# Patient Record
Sex: Male | Born: 1940 | Race: White | Hispanic: No | Marital: Married | Smoking: Never smoker
Health system: Southern US, Community
[De-identification: ages and names within clinical notes are randomized; demographics above are authoritative.]

## PROBLEM LIST (undated history)

## (undated) DIAGNOSIS — B070 Plantar wart: Secondary | ICD-10-CM

## (undated) DIAGNOSIS — I499 Cardiac arrhythmia, unspecified: Secondary | ICD-10-CM

## (undated) DIAGNOSIS — E669 Obesity, unspecified: Secondary | ICD-10-CM

## (undated) DIAGNOSIS — E785 Hyperlipidemia, unspecified: Secondary | ICD-10-CM

## (undated) DIAGNOSIS — Z6832 Body mass index (BMI) 32.0-32.9, adult: Secondary | ICD-10-CM

## (undated) DIAGNOSIS — K56609 Unspecified intestinal obstruction, unspecified as to partial versus complete obstruction: Secondary | ICD-10-CM

## (undated) DIAGNOSIS — E119 Type 2 diabetes mellitus without complications: Secondary | ICD-10-CM

## (undated) DIAGNOSIS — K219 Gastro-esophageal reflux disease without esophagitis: Secondary | ICD-10-CM

## (undated) DIAGNOSIS — J45909 Unspecified asthma, uncomplicated: Secondary | ICD-10-CM

## (undated) DIAGNOSIS — J339 Nasal polyp, unspecified: Secondary | ICD-10-CM

## (undated) DIAGNOSIS — R030 Elevated blood-pressure reading, without diagnosis of hypertension: Secondary | ICD-10-CM

## (undated) DIAGNOSIS — R809 Proteinuria, unspecified: Secondary | ICD-10-CM

## (undated) DIAGNOSIS — I4891 Unspecified atrial fibrillation: Secondary | ICD-10-CM

## (undated) DIAGNOSIS — J309 Allergic rhinitis, unspecified: Secondary | ICD-10-CM

## (undated) DIAGNOSIS — E7800 Pure hypercholesterolemia, unspecified: Secondary | ICD-10-CM

## (undated) HISTORY — DX: Nasal polyp, unspecified: J33.9

## (undated) HISTORY — DX: Plantar wart: B07.0

## (undated) HISTORY — DX: Proteinuria, unspecified: R80.9

## (undated) HISTORY — DX: Allergic rhinitis, unspecified: J30.9

## (undated) HISTORY — DX: Elevated blood-pressure reading, without diagnosis of hypertension: R03.0

## (undated) HISTORY — DX: Unspecified atrial fibrillation: I48.91

## (undated) HISTORY — DX: Cardiac arrhythmia, unspecified: I49.9

## (undated) HISTORY — DX: Obesity, unspecified: E66.9

## (undated) HISTORY — DX: Body mass index (BMI) 32.0-32.9, adult: Z68.32

## (undated) HISTORY — DX: Unspecified intestinal obstruction, unspecified as to partial versus complete obstruction: K56.609

## (undated) HISTORY — DX: Type 2 diabetes mellitus without complications: E11.9

## (undated) HISTORY — DX: Gastro-esophageal reflux disease without esophagitis: K21.9

## (undated) HISTORY — DX: Pure hypercholesterolemia, unspecified: E78.00

## (undated) HISTORY — DX: Hyperlipidemia, unspecified: E78.5

## (undated) HISTORY — DX: Unspecified asthma, uncomplicated: J45.909

---

## 1964-01-26 HISTORY — PX: CYST EXCISION: SHX5701

## 1998-10-19 ENCOUNTER — Encounter: Payer: Self-pay | Admitting: Family Medicine

## 1998-10-19 ENCOUNTER — Ambulatory Visit (HOSPITAL_COMMUNITY): Admission: RE | Admit: 1998-10-19 | Discharge: 1998-10-19 | Payer: Self-pay | Admitting: Family Medicine

## 2004-02-05 ENCOUNTER — Ambulatory Visit (HOSPITAL_COMMUNITY): Admission: RE | Admit: 2004-02-05 | Discharge: 2004-02-05 | Payer: Self-pay | Admitting: Family Medicine

## 2004-06-03 ENCOUNTER — Ambulatory Visit: Payer: Self-pay | Admitting: Pulmonary Disease

## 2004-08-12 ENCOUNTER — Ambulatory Visit: Payer: Self-pay | Admitting: Pulmonary Disease

## 2005-07-29 ENCOUNTER — Ambulatory Visit: Payer: Self-pay | Admitting: Pulmonary Disease

## 2006-09-30 ENCOUNTER — Ambulatory Visit: Payer: Self-pay | Admitting: Pulmonary Disease

## 2007-10-25 ENCOUNTER — Ambulatory Visit: Payer: Self-pay | Admitting: Pulmonary Disease

## 2007-10-25 DIAGNOSIS — J45909 Unspecified asthma, uncomplicated: Secondary | ICD-10-CM

## 2007-10-26 ENCOUNTER — Telehealth: Payer: Self-pay | Admitting: Pulmonary Disease

## 2008-01-26 HISTORY — PX: ABDOMINAL EXPLORATION SURGERY: SHX538

## 2008-10-24 ENCOUNTER — Ambulatory Visit: Payer: Self-pay | Admitting: Pulmonary Disease

## 2009-03-05 ENCOUNTER — Telehealth (INDEPENDENT_AMBULATORY_CARE_PROVIDER_SITE_OTHER): Payer: Self-pay | Admitting: *Deleted

## 2010-02-24 NOTE — Progress Notes (Signed)
Summary: Records request from [facility]  Request for records received from [facility]. Request forwarded to [facility]. Wilder Glade  March 05, 2009 4:23 PM

## 2010-04-10 ENCOUNTER — Ambulatory Visit (INDEPENDENT_AMBULATORY_CARE_PROVIDER_SITE_OTHER): Payer: Medicare Other | Admitting: Pulmonary Disease

## 2010-04-10 ENCOUNTER — Encounter: Payer: Self-pay | Admitting: Pulmonary Disease

## 2010-04-10 DIAGNOSIS — J45909 Unspecified asthma, uncomplicated: Secondary | ICD-10-CM

## 2010-04-14 NOTE — Assessment & Plan Note (Signed)
Summary: rov for asthma    Primary Provider/Referring Provider:  Thacker  CC:  Follow up appt.    Pt states his breathing is "good."  Denies any sob.  States he hasn't needed to use rescue hfa.  Pt states he only uses his advair once daily. Arthur Liu  History of Present Illness: the pt comes in today for f/u of his known asthma.  He has been taking his advair compliantly at one inhalation a day, and has not had breathing issues or any recent acute exacerbation.  He is asking about coming off meds since he is doing so well, but I have reviewed with him again the pathophysiology of asthma with ongoing airway inflammation.  I have explained it is a chronic disease process and needs daily treatment.  He rarely uses his rescue inhaler.   Preventive Screening-Counseling & Management  Alcohol-Tobacco     Smoking Status: never  Current Medications (verified): 1)  Advair Diskus 250-50 Mcg/dose Misc (Fluticasone-Salmeterol) .... Inhale 1 Puff As Directed Twice A Day 2)  Crestor 20 Mg Tabs (Rosuvastatin Calcium) .... Once Daily 3)  Adult Aspirin Ec Low Strength 81 Mg Tbec (Aspirin) .... Once Daily 4)  Proair Hfa 108 (90 Base) Mcg/act  Aers (Albuterol Sulfate) .Arthur Liu.. 1-2 Puffs Every 4-6 Hours As Needed  Allergies (verified): No Known Drug Allergies  Social History: Patient never smoked.  Smoking Status:  never  Review of Systems  The patient denies shortness of breath with activity, shortness of breath at rest, productive cough, non-productive cough, coughing up blood, chest pain, irregular heartbeats, acid heartburn, indigestion, loss of appetite, weight change, abdominal pain, difficulty swallowing, sore throat, tooth/dental problems, headaches, nasal congestion/difficulty breathing through nose, sneezing, itching, ear ache, anxiety, depression, hand/feet swelling, joint stiffness or pain, rash, change in color of mucus, and fever.    Vital Signs:  Patient profile:   70 year old male Height:      73  inches Weight:      277.13 pounds BMI:     36.70 O2 Sat:      97 % on Room air Temp:     98.0 degrees F oral Pulse rate:   88 / minute BP sitting:   136 / 82  (right arm) Cuff size:   large  Vitals Entered By: Arman Filter LPN (April 10, 2010 9:11 AM)  O2 Flow:  Room air CC: Follow up appt.    Pt states his breathing is "good."  Denies any sob.  States he hasn't needed to use rescue hfa.  Pt states he only uses his advair once daily.  Comments Medications reviewed with patient Arman Filter LPN  April 10, 2010 9:12 AM    Physical Exam  General:  wd male in nad  Lungs:  totally clear to auscultation  Heart:  rrr, 2/6 sem Extremities:  mild ankle edema, no cyanosis  Neurologic:  alert and oriented, moves all 4 .    Impression & Recommendations:  Problem # 1:  INTRINSIC ASTHMA, UNSPECIFIED (ICD-493.10) the pt is doing well on advair at one inhalation a day, and I discussed de-escalating his treatment to ICS alone.  He would rather stick with what is working.  I have asked him to continue with current treatment, and to followup with me in one year.   Medications Added to Medication List This Visit: 1)  Advair Diskus 250-50 Mcg/dose Misc (Fluticasone-salmeterol) .... Inhale 1 puff once daily  Other Orders: Est. Patient Level III (40981)  Patient Instructions: 1)  continue on advair and albuterol as needed. 2)  work on weight reduction and exercise program 3)  followup with me in one year   Prescriptions: PROAIR HFA 108 (90 BASE) MCG/ACT  AERS (ALBUTEROL SULFATE) 1-2 puffs every 4-6 hours as needed  #2 x 12   Entered and Authorized by:   Barbaraann Share MD   Signed by:   Barbaraann Share MD on 04/10/2010   Method used:   Print then Give to Patient   RxID:   [id number]

## 2010-10-18 ENCOUNTER — Emergency Department (HOSPITAL_COMMUNITY): Payer: Medicare Other

## 2010-10-18 ENCOUNTER — Inpatient Hospital Stay (HOSPITAL_COMMUNITY)
Admission: EM | Admit: 2010-10-18 | Discharge: 2010-10-19 | DRG: 313 | Disposition: A | Discharge status: Home / Self Care | Payer: Medicare Other | Attending: Family Medicine | Admitting: Family Medicine

## 2010-10-18 DIAGNOSIS — R079 Chest pain, unspecified: Principal | ICD-10-CM | POA: Diagnosis present

## 2010-10-18 DIAGNOSIS — K219 Gastro-esophageal reflux disease without esophagitis: Secondary | ICD-10-CM | POA: Diagnosis present

## 2010-10-18 DIAGNOSIS — J45909 Unspecified asthma, uncomplicated: Secondary | ICD-10-CM | POA: Diagnosis present

## 2010-10-18 DIAGNOSIS — E785 Hyperlipidemia, unspecified: Secondary | ICD-10-CM | POA: Diagnosis present

## 2010-10-19 LAB — COMPREHENSIVE METABOLIC PANEL
ALT: 22 U/L (ref 0–53)
Alkaline Phosphatase: 60 U/L (ref 39–117)
BUN: 16 mg/dL (ref 6–23)
CO2: 28 mEq/L (ref 19–32)
Calcium: 9.3 mg/dL (ref 8.4–10.5)
GFR calc Af Amer: >60 mL/min (ref >60)
GFR calc non Af Amer: >60 mL/min (ref >60)
Glucose, Bld: 109 mg/dL — ABNORMAL HIGH (ref 70–99)
Sodium: 140 mEq/L (ref 135–145)

## 2010-10-19 LAB — DIFFERENTIAL
Basophils Absolute: 0 10*3/uL (ref 0.0–0.1)
Basophils Relative: 0 % (ref 0–1)
Eosinophils Absolute: 0.2 10*3/uL (ref 0.0–0.7)
Eosinophils Relative: 2 % (ref 0–5)
Lymphocytes Relative: 38 % (ref 12–46)
Lymphs Abs: 3.6 10*3/uL (ref 0.7–4.0)
Monocytes Absolute: 0.6 10*3/uL (ref 0.1–1.0)
Monocytes Relative: 7 % (ref 3–12)
Neutro Abs: 5 10*3/uL (ref 1.7–7.7)
Neutrophils Relative %: 53 % (ref 43–77)

## 2010-10-19 LAB — CBC
MCH: 30.6 pg (ref 26.0–34.0)
MCHC: 33.6 g/dL (ref 30.0–36.0)
MCV: 91.3 fL (ref 78.0–100.0)
Platelets: 255 10*3/uL (ref 150–400)
RBC: 5.03 MIL/uL (ref 4.22–5.81)

## 2010-10-19 LAB — POCT I-STAT, CHEM 8
Calcium, Ion: 1.18 mmol/L (ref 1.12–1.32)
HCT: 48 % (ref 39.0–52.0)
TCO2: 28 mmol/L (ref 0–100)

## 2010-10-19 LAB — LIPID PANEL
HDL: 37 mg/dL — ABNORMAL LOW (ref >39)
LDL Cholesterol: 56 mg/dL (ref 0–99)
Triglycerides: 53 mg/dL (ref <150)
VLDL: 11 mg/dL (ref 0–40)

## 2010-10-19 LAB — POCT I-STAT TROPONIN I: Troponin i, poc: 0 ng/mL (ref 0.00–0.08)

## 2010-10-19 LAB — CK TOTAL AND CKMB (NOT AT ARMC)
Relative Index: INVALID (ref 0.0–2.5)
Total CK: 61 U/L (ref 7–232)

## 2010-10-20 NOTE — Discharge Summary (Signed)
NAMEMarland Kitchen  Arthur Liu, Arthur Liu NO.:  [id number]  MEDICAL RECORD NO.:  [id number]  LOCATION:  1441                         FACILITY:  [facility]  PHYSICIAN:  Brendia Sacks, MD    DATE OF BIRTH:  21-Sep-1940  DATE OF ADMISSION:  10/18/2010 DATE OF DISCHARGE:  10/19/2010                              DISCHARGE SUMMARY   PRIMARY CARE PHYSICIAN:  Caryn Bee L. Little, M.D.  CONDITION ON DISCHARGE:  Improved.  DISPOSITION:  Home.  DISCHARGE DIAGNOSES: 1. Atypical chest pain. 2. History of gastroesophageal reflux disease and hyperlipidemia.  HISTORY OF PRESENT ILLNESS:  This is a 70 year old male who presented with chest pain.  The patient had several episodes of chest pain, his first was while he was lying in bed, September 22nd, and describes it as somewhat of a burning sensation in his chest.  This did not last very long, self-resolved, and he went about his day.  It occurred a second time on September 23rd after eating dinner while he was sitting up.  It persisted for 4-5 hours and so he came to the emergency room for further evaluation.  He was given aspirin.  His symptoms resolved in the emergency room, or by the time of arrival he is not exactly sure, but the only medication he was given was aspirin and he is not clear whether that had any effect on his symptoms or not.  Because of his risk factors, he was admitted for further evaluation.  The patient is a fairly active man and walks at least 3 times per week. He went for a 45 minute walk with his wife 2 days ago.  He has never had any chest pain or shortness of breath, no exertional symptoms what ever with his normal activity levels.  He does have a history of reflux in the past; in 2006, he had an upper GI done, which showed 2 episodes of gastroesophageal reflux and a widely patent distal esophageal mucosal ring that was not thought to be causing symptoms.  Additionally, at that time, the study showed a partial  disruption of primary peristaltic waves in the esophagus, suggesting mild nonspecific esophageal dysfunction. This study was done for GERD and wheezing at night.  I think the patient's history is most suggestive of recurrent gastroesophageal symptoms, given 2 episodes occurring while lying in bed and one occurring after a meal.  Additionally, he has had no symptoms with exertion.  He has had no pain in his left arm, neck, or jaw.  No nausea or vomiting or diaphoresis.  He had a transient episode this morning while he was lying down, as soon as he sat up he felt much better and he has had no symptoms since.  Additionally, he has recently done 2 things that he has not done for some time.  One is he started golfing again and also he, on Saturday, 2 days ago, he tested out some weights machine including a fly machine. His wife thinks this might be musculoskeletal in nature.  HOSPITAL COURSE:  Mr. Cwikla was admitted to the medical floor on telemetry and ruled out with serial cardiac enzymes.  His telemetry shows sinus rhythm with no acute changes.  Remainder of his laboratory studies have been unremarkable and his chest x-ray was also unremarkable.  As noted in the history above, this is most likely gastrointestinal in etiology and I think he would benefit from being placed back on Nexium.  Another possibility would be musculoskeletal in nature.  Given his usually in active man and lack of typical symptoms, I do doubt that this is cardiac in nature; however, he does have risk factors including hyperlipidemia.  He has multiple family members with a history of myocardial infarction, of note none of them were of premature age.  His mother had heart attack at age 45, not age 67.  At this point, he feels quite well and he would like to go home.  I did discuss the case with Dr. Eldridge Dace of [facility] who will arrange for outpatient nuclear stress testing as I do think this would be a  useful study.  However, I do not think he requires further inpatient evaluation.  IMAGING:  Chest x-ray, no acute cardiopulmonary disease.  PERTINENT LABORATORY STUDIES: 1. TSH was within normal limits. 2. Lipid profile was notable for an LDL of 56, total cholesterol of     104. 3. CBC was within normal limits. 4. Basic metabolic panel was unremarkable. 5. Cardiac enzymes were entirely within normal limits. 6. EKG showed sinus rhythm with no acute changes.  In fact the patient     had multiple EKGs done during this hospitalization, all of them     showing sinus rhythm with no acute changes.  DISCHARGE INSTRUCTIONS:  The patient will be discharged home today. [facility] cardiology will contact him to arrange for stress testing, increase activity slowly.  Low-sodium heart-healthy diet.  DISCHARGE MEDICATIONS:  New, 1. Nexium 40 mg p.o. daily. 2. Nitroglycerin sublingual 0.4 mg, under the tongue, as needed for     chest pain and call 911.  Doubt cardiac disease, but until nuclear     stress testing will provide him with his prescription. 3. Advair.  Resume the following medications. 1. Advair Diskus 250/50 one puff daily. 2. Aspirin mg p.o. daily. 3. Crestor 20 mg p.o. daily.  TIME COORDINATING DISCHARGE:  35 minutes.     Brendia Sacks, MD     DG/MEDQ  D:  10/19/2010  T:  10/20/2010  Job:  [id number]  cc:   Caryn Bee L. Little, M.D. Fax: [phone]  [facility]  Electronically Signed by Brendia Sacks  on 10/20/2010 10:31:26 PM

## 2010-11-05 NOTE — H&P (Signed)
NAMEMarland Liu  PRABHAV, FAULKENBERRY NO.:  [id number]  MEDICAL RECORD NO.:  [id number]  LOCATION:  1441                         FACILITY:  [facility]  PHYSICIAN:  [name], MDDATE OF BIRTH:  Jan 18, 1941  DATE OF ADMISSION:  10/18/2010 DATE OF DISCHARGE:                             HISTORY & PHYSICAL   ADMISSION DIAGNOSIS:  Chest pain.  HISTORY OF PRESENT ILLNESS:  Arthur Liu was admitted from the emergency department with symptoms of chest pain.  He states that he was awakened from sleep earlier in the evening with symptoms of left sided and sternal chest pain.  He states it lasted approximately 10 to 12 minutes and was relieved with walking.  After experiencing several episodes, he sought medical attention.  He currently denies any chest pain, any shortness of breath, any nausea, or vomiting.  PAST MEDICAL HISTORY: 1. Hypertension. 2. Hyperlipidemia. 3. Asthma.  ALLERGIES:  No known drug allergies.  CURRENT MEDICATIONS: 1. Crestor 20 mg daily. 2. Aspirin 81 mg daily. 3. Advair 50/250 q.a.m.  FAMILY HISTORY:  Arthur Liu mother and father both deceased from myocardial infarction at an early age 43 and 24 respectively.  He has one brother who is also recently deceased in 04-14-2022 of this year from complications due to myocardial infarction.  SOCIAL HISTORY:  Arthur Liu is married.  He is semi-retired.  He does not smoke cigarettes.  He drinks alcohol occasionally.  He denies any illicit drug use.  REVIEW OF SYSTEMS:  All systems reviewed and are otherwise negative except for that covered in history of present illness.  PHYSICAL EXAMINATION:  VITAL SIGNS:  Temperature 97.3, pulse 68, blood pressure 156/83, respirations 20, oxygen saturation 97% on room air. GENERAL:  [name] is alert and oriented.  He is in no acute distress. HEENT:  Within normal limits.  Oropharynx clear. LUNGS:  Clear to auscultation bilaterally. CARDIAC:  Regular rate and rhythm  without murmurs, rubs, or gallop. ABDOMEN:  Soft, nontender.  No appreciated organomegaly or masses. EXTREMITIES:  Without edema or cyanosis. NEUROLOGIC:  Without focal deficits.  LABORATORY DATA:  Hemoglobin 15.4, hematocrit 45.9, platelets 255. Differential:  Segmented neutrophils 53%, lymphocytes 38%, monocytes 7%, eosinophils 2%.  PT 13.1, INR 0.97, PTT 36.  IMPRESSION AND PLAN:  A 70 year old gentleman admitted with chest pain, 1. Rule out myocardial infarction.  Admitted to inpatient telemetry, [facility]    Hospitalist Team 5. Evaluate EKG, placed on 2 L nasal    cannula, nitroglycerin as needed for chest pain, Morphine 2 mg IV    every 4 hours as needed for pain, Aspirin 325 mg daily.  Continue    to cycle cardiac enzymes, troponin every 6 hours x2 more sets. 2. Hyperlipidemia.  Continue Crestor. 3. History of asthma.  Continue Advair q.a.m. 4. Fluids, electrolytes, nutrition.  Saline Lock peripheral IV.    Obtain CMET and address electrolytes as indicated.  Heart-     healthy diet. 5. Prophylaxis.  Lovenox for deep venous thrombosis prophylaxis. 6. Ethics.  Full code.    ______________________________ Arthur Mcgregor, NP   ______________________________ [name], MD    ML/MEDQ  D:  10/19/2010  T:  10/19/2010  Job:  [id number]  Electronically Signed by Arthur Liu ANP-BC on 10/20/2010 07:21:04 PM Electronically Signed by Arthur Loll MD on 11/05/2010 08:04:05 AM

## 2011-01-08 ENCOUNTER — Other Ambulatory Visit: Payer: Self-pay | Admitting: Pulmonary Disease

## 2012-03-06 ENCOUNTER — Other Ambulatory Visit: Payer: Self-pay | Admitting: Pulmonary Disease

## 2012-03-06 NOTE — Telephone Encounter (Signed)
Patient requests refill of ADVAIR 250-50 DISKUS [facility] [address] [facility] [address] Last OV 04/10/10 NO upcoming OV  Refill has been sent to pharmacy with note to follow up with [facility] in order to receive further refills.   ADVAIR 250-50 DISKUS Sig: INHALE 1 PUFF TWICE DAILY AS DIRECTED Dispense: 60 each Refills: 0 NEEDS TO AN APPT FOR FURTHER REFILLS.

## 2012-05-08 ENCOUNTER — Other Ambulatory Visit: Payer: Self-pay | Admitting: Pulmonary Disease

## 2012-05-09 ENCOUNTER — Telehealth: Payer: Self-pay | Admitting: Pulmonary Disease

## 2012-05-09 MED ORDER — FLUTICASONE-SALMETEROL 250-50 MCG/DOSE IN AEPB: 1.0000 | INHALATION_SPRAY | Freq: Two times a day (BID) | RESPIRATORY_TRACT | Status: DC

## 2012-05-09 NOTE — Telephone Encounter (Signed)
Spoke with patient, patient requesting refill on Advair. States he is leaving town tomorrow afternoon and would like Rx sent in to pharmacy. Patient last seen 04/10/2010  Patient has scheduled follow up 06/05/12 at 1030am. (this is earliest patient can come, he will be in and out of town) Dr. Shelle Iron can this Rx be sent for patient?

## 2012-05-09 NOTE — Telephone Encounter (Signed)
Spoke with patient, patient aware Rx sent to pharmacy. Aware he must keep f/u appt for further refills. Nothing further needed at this time.

## 2012-05-09 NOTE — Telephone Encounter (Signed)
Ok to give him #1, no fills.

## 2012-06-05 ENCOUNTER — Encounter: Payer: Self-pay | Admitting: Pulmonary Disease

## 2012-06-05 ENCOUNTER — Ambulatory Visit (INDEPENDENT_AMBULATORY_CARE_PROVIDER_SITE_OTHER): Payer: Medicare Other | Admitting: Pulmonary Disease

## 2012-06-05 VITALS — BP 140/80 | HR 68 | Temp 97.0°F | Ht 72.0 in | Wt 249.6 lb

## 2012-06-05 DIAGNOSIS — J45909 Unspecified asthma, uncomplicated: Secondary | ICD-10-CM

## 2012-06-05 MED ORDER — ALBUTEROL SULFATE HFA 108 (90 BASE) MCG/ACT IN AERS: 2.0000 | INHALATION_SPRAY | Freq: Four times a day (QID) | RESPIRATORY_TRACT | Status: AC | PRN

## 2012-06-05 MED ORDER — FLUTICASONE-SALMETEROL 250-50 MCG/DOSE IN AEPB: 1.0000 | INHALATION_SPRAY | Freq: Two times a day (BID) | RESPIRATORY_TRACT | Status: DC

## 2012-06-05 NOTE — Assessment & Plan Note (Signed)
The patient is doing very well from an asthma standpoint, with no recent exacerbation or rescue inhaler use.  I've asked him to continue on his current medications, and to stay as active as possible.  He will followup with me on a yearly basis.

## 2012-06-05 NOTE — Patient Instructions (Addendum)
Stay on current asthma medications. followup with me in one year.

## 2012-06-05 NOTE — Progress Notes (Signed)
  Subjective:    Patient ID: Arthur Liu, male    DOB: 12-20-1940, 72 y.o.   MRN: [id number]  HPI The patient comes in today for followup of his known asthma.  He has not been seen in 2 years, but has done very well from a pulmonary standpoint.  He has stayed compliant on his Advair at a once a day dosing, and has not required his rescue inhaler.  He has not had any acute exacerbation, and is satisfied with his control.   Review of Systems  Constitutional: Negative for fever and unexpected weight change.  HENT: Negative for ear pain, nosebleeds, congestion, sore throat, rhinorrhea, sneezing, trouble swallowing, dental problem, postnasal drip and sinus pressure.        Allergies  Eyes: Negative for redness and itching.  Respiratory: Negative for cough, chest tightness, shortness of breath and wheezing.   Cardiovascular: Negative for palpitations and leg swelling.  Gastrointestinal: Negative for nausea and vomiting.  Genitourinary: Negative for dysuria.  Musculoskeletal: Negative for joint swelling.  Skin: Negative for rash.  Neurological: Negative for headaches.  Hematological: Does not bruise/bleed easily.  Psychiatric/Behavioral: Negative for dysphoric mood. The patient is not nervous/anxious.        Objective:   Physical Exam Well developed male in nad Nose without purulence or discharge noted. Neck without LN or TMG Chest with clear bs bilat, no wheezing Cor with rrr LE without edema, no cyanosis Alert and oriented, moves all 4.        Assessment & Plan:

## 2012-08-25 ENCOUNTER — Other Ambulatory Visit: Payer: Self-pay | Admitting: Family Medicine

## 2012-08-25 DIAGNOSIS — J329 Chronic sinusitis, unspecified: Secondary | ICD-10-CM

## 2012-08-28 ENCOUNTER — Ambulatory Visit
Admission: RE | Admit: 2012-08-28 | Discharge: 2012-08-28 | Disposition: A | Discharge status: Home / Self Care | Payer: Medicare Other | Source: Ambulatory Visit | Attending: Family Medicine | Admitting: Family Medicine

## 2012-08-28 DIAGNOSIS — J329 Chronic sinusitis, unspecified: Secondary | ICD-10-CM

## 2012-11-06 ENCOUNTER — Ambulatory Visit (INDEPENDENT_AMBULATORY_CARE_PROVIDER_SITE_OTHER): Payer: Medicare Other | Admitting: Internal Medicine

## 2012-11-06 ENCOUNTER — Encounter: Payer: Self-pay | Admitting: Internal Medicine

## 2012-11-06 ENCOUNTER — Other Ambulatory Visit: Payer: Medicare Other

## 2012-11-06 VITALS — BP 118/66 | HR 70 | Ht 72.0 in | Wt 258.6 lb

## 2012-11-06 DIAGNOSIS — J309 Allergic rhinitis, unspecified: Secondary | ICD-10-CM

## 2012-11-06 DIAGNOSIS — J339 Nasal polyp, unspecified: Secondary | ICD-10-CM

## 2012-11-06 DIAGNOSIS — J302 Other seasonal allergic rhinitis: Secondary | ICD-10-CM

## 2012-11-06 MED ORDER — MOMETASONE FUROATE 50 MCG/ACT NA SUSP: 2.0000 | Freq: Every day | NASAL | Status: DC

## 2012-11-06 MED ORDER — METHYLPREDNISOLONE ACETATE 80 MG/ML IJ SUSP
80.0000 mg | Freq: Once | INTRAMUSCULAR | Status: AC
  Administered 2012-11-06: 80 mg via INTRAMUSCULAR

## 2012-11-06 NOTE — Patient Instructions (Signed)
Sample x 2 Nasonex nasal steroid spray    1-2 puffs each nostril once daily at bedtime  Depo 80    Dx nasal polyps. Allergic rhinitis  Order lab Allergy profile

## 2012-11-06 NOTE — Progress Notes (Signed)
11/06/12- 89 yoM never smoker referred courtesy of Dr Haroldine Laws for Allergy evaluation. Has been followed here by Dr Shelle Iron for asthma. Complains of "sinus trouble" every fall and spring with head congestion, stuffy nose. Not much sneeze or drainage. Repeated trials of antibiotics did not help.  No ENT surgery. Irregular trial of nasal sprays. Never considered himself and allergic person before. No recognized association with common allergens, grass, cats, house dust, mold. History of asthma controlled with Advair one time daily. Environment-lives in house with no basement, no recognized mold, no animals and no smokers. 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 He is aware of extensive nasal polyposis but does not want to consider surgery. 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  Sub total opacification of the ethmoid air cells and left maxillary  sinus. Right frontal and sphenoid sinuses relatively spared.  Original Report Authenticated By: Erskine Speed, M.D.  Prior to Admission medications   Medication Sig Start Date End Date Taking? Authorizing Provider  albuterol (PROVENTIL HFA;VENTOLIN HFA) 108 (90 BASE) MCG/ACT inhaler Inhale 2 puffs into the lungs every 6 (six) hours as needed for wheezing. 06/05/12  Yes Barbaraann Share, MD  aspirin 81 MG tablet Take 81 mg by mouth daily.   Yes Historical Provider, MD  Fluticasone-Salmeterol (ADVAIR DISKUS) 250-50 MCG/DOSE AEPB Inhale 1 puff into the lungs every 12 (twelve) hours. 06/05/12  Yes Barbaraann Share, MD  rosuvastatin (CRESTOR) 20 MG tablet Take 20 mg by mouth daily.   Yes Historical Provider, MD  mometasone (NASONEX) 50 MCG/ACT nasal spray Place 2 sprays into the nose daily. 11/06/12   Waymon Budge, MD   Past Medical History  Diagnosis Date  . Asthma   . Allergic rhinitis    Past  Surgical History  Procedure Laterality Date  . Abdominal exploration surgery  2010    found blockage on cxr---surgery showed nothing  . Cyst excision  1966   Family History  Problem Relation Age of Onset  . Heart attack Mother   . Heart attack Father   . Heart disease Brother   . Prostate cancer Brother    History   Social History  . Marital Status: Married    Spouse Name: N/A    Number of Children: 2  . Years of Education: N/A   Occupational History  . 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 Smoking status: Never Smoker   . Smokeless tobacco: Not on file  . Alcohol Use: Yes     Comment: occassional//rare-about 2 glasses of white wine a month  . Drug Use: No  . Sexual Activity: Not on file   Other Topics Concern  . Not on file   Social History Narrative  . No narrative on file   ROS-see HPI Constitutional:   No-   weight loss, night sweats, fevers, chills, fatigue, lassitude. HEENT:   No-  headaches, difficulty swallowing, tooth/dental problems, sore throat,       No-  sneezing, itching, ear ache, +nasal congestion, post nasal drip,  CV:  No-   chest pain, orthopnea, PND, swelling in lower extremities, anasarca, dizziness, palpitations Resp: No-   shortness of breath with exertion or at rest.              No-   productive cough,  No non-productive  cough,  No- coughing up of blood.              No-   change in color of mucus.  No- wheezing.   Skin: No-   rash or lesions. GI:  No-   heartburn, indigestion, abdominal pain, nausea, vomiting, diarrhea,                 change in bowel habits, loss of appetite GU: No-   dysuria, change in color of urine, no urgency or frequency.  No- flank pain. MS:  No-   joint pain or swelling.  No- decreased range of motion.  No- back pain. Neuro-     nothing unusual Psych:  No- change in mood or affect. No depression or anxiety.  No memory loss.  OBJ- Physical Exam General- Alert, Oriented,  Affect-appropriate, Distress- none acute Skin- rash-none, lesions- none, excoriation- none Lymphadenopathy- none Head- atraumatic            Eyes- Gross vision intact, PERRLA, conjunctivae and secretions clear            Ears- Hearing, canals-normal            Nose- +visible polyps R>L            Throat- Mallampati III , mucosa clear , drainage- none, tonsils- atrophic Neck- flexible , trachea midline, no stridor , thyroid nl, carotid no bruit Chest - symmetrical excursion , unlabored           Heart/CV- RRR , no murmur , no gallop  , no rub, nl s1 s2                           - JVD- none , edema- none, stasis changes- none, varices- none           Lung- clear to P&A, wheeze- none, cough- none , dullness-none, rub- none           Chest wall-  Abd- tender-no, distended-no, bowel sounds-present, HSM- no Br/ Gen/ Rectal- Not done, not indicated Extrem- cyanosis- none, clubbing, none, atrophy- none, strength- nl Neuro- grossly intact to observation  .

## 2012-11-07 LAB — ALLERGY FULL PROFILE
Alternaria Alternata: <0.1 kU/L
Bermuda Grass: 0.7 kU/L — ABNORMAL HIGH
Candida Albicans: <0.1 kU/L
Cat Dander: <0.1 kU/L
Curvularia lunata: <0.1 kU/L
Dog Dander: <0.1 kU/L
Fescue: 1.05 kU/L — ABNORMAL HIGH
Goldenrod: <0.1 kU/L
Helminthosporium halodes: <0.1 kU/L
Lamb's Quarters: <0.1 kU/L
Sycamore Tree: <0.1 kU/L

## 2012-11-09 NOTE — Progress Notes (Signed)
Quick Note:  Pt aware of results. ______ 

## 2012-11-19 DIAGNOSIS — J339 Nasal polyp, unspecified: Secondary | ICD-10-CM | POA: Insufficient documentation

## 2012-11-19 DIAGNOSIS — J302 Other seasonal allergic rhinitis: Secondary | ICD-10-CM | POA: Insufficient documentation

## 2012-11-19 NOTE — Assessment & Plan Note (Signed)
No history of aspirin intolerance. He is strongly averse to any surgical procedure. We discussed the role of steroids. Plan-Depo-Medrol, Nasonex long-term maintenance use with followup CT

## 2012-11-19 NOTE — Assessment & Plan Note (Signed)
Spring and fall predominance may reflect atopic allergies superimposed on underlying nasal polyposis Plan-allergy profile

## 2012-12-26 ENCOUNTER — Ambulatory Visit (INDEPENDENT_AMBULATORY_CARE_PROVIDER_SITE_OTHER): Payer: Medicare Other | Admitting: Internal Medicine

## 2012-12-26 ENCOUNTER — Encounter: Payer: Self-pay | Admitting: Internal Medicine

## 2012-12-26 VITALS — BP 120/62 | HR 74 | Ht 72.0 in | Wt 262.0 lb

## 2012-12-26 DIAGNOSIS — J45909 Unspecified asthma, uncomplicated: Secondary | ICD-10-CM

## 2012-12-26 DIAGNOSIS — J309 Allergic rhinitis, unspecified: Secondary | ICD-10-CM

## 2012-12-26 DIAGNOSIS — J339 Nasal polyp, unspecified: Secondary | ICD-10-CM

## 2012-12-26 DIAGNOSIS — J302 Other seasonal allergic rhinitis: Secondary | ICD-10-CM

## 2012-12-26 NOTE — Progress Notes (Signed)
11/06/12- 38 yoM never smoker referred courtesy of Dr Haroldine Laws for Allergy evaluation. Has been followed here by Dr Shelle Iron for asthma. Complains of "sinus trouble" every fall and spring with head congestion, stuffy nose. Not much sneeze or drainage. Repeated trials of antibiotics did not help.  No ENT surgery. Irregular trial of nasal sprays. Never considered himself and allergic person before. No recognized association with common allergens, grass, cats, house dust, mold. History of asthma controlled with Advair one time daily. Environment-lives in house with no basement, no recognized mold, no animals and no smokers. 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 He is aware of extensive nasal polyposis but does not want to consider surgery. 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  Sub total opacification of the ethmoid air cells and left maxillary  sinus. Right frontal and sphenoid sinuses relatively spared.  Original Report Authenticated By: Erskine Speed, M.D.  12/26/12- 89 yoM never smoker with severe nasal polyposis, referred courtesy of Dr Haroldine Laws for Allergy evaluation. Has been followed here by Dr Shelle Iron for asthma.  remains comfortable with no exacerbation- watching seasonal factors. 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  ROS-see HPI Constitutional:   No-   weight loss, night sweats, fevers, chills, fatigue, lassitude. HEENT:   No-  headaches, difficulty swallowing, tooth/dental problems, sore throat,       No-  sneezing, itching, ear ache, +nasal congestion, post nasal drip,  CV:  No-   chest pain, orthopnea, PND, swelling in lower extremities, anasarca, dizziness, palpitations Resp:  No-   shortness of breath with exertion or at rest.              No-   productive cough,  No non-productive cough,  No- coughing up of blood.              No-   change in color of mucus.  No- wheezing.   Skin: No-   rash or lesions. GI:  No-   heartburn, indigestion, abdominal pain, nausea, vomiting,  GU:  MS:  No-   joint pain or swelling.  Neuro-     nothing unusual Psych:  No- change in mood or affect. No depression or anxiety.  No memory loss.  OBJ- Physical Exam General- Alert, Oriented, Affect-appropriate, Distress- none acute Skin- rash-none, lesions- none, excoriation- none Lymphadenopathy- none Head- atraumatic            Eyes- Gross vision intact, PERRLA, conjunctivae and secretions clear            Ears- Hearing, canals-normal            Nose- +visible polyps R>L            Throat- Mallampati III , mucosa clear , drainage- none, tonsils- atrophic Neck- flexible , trachea midline, no stridor , thyroid nl, carotid no bruit Chest - symmetrical excursion , unlabored           Heart/CV- RRR , no murmur , no gallop  , no rub, nl s1 s2                           - JVD- none , edema- none, stasis changes- none,  varices- none           Lung- clear to P&A, wheeze- none, cough- none , dullness-none, rub- none           Chest wall-  Abd-  Br/ Gen/ Rectal- Not done, not indicated Extrem- cyanosis- none, clubbing, none, atrophy- none, strength- nl Neuro- grossly intact to observation  .

## 2012-12-26 NOTE — Patient Instructions (Signed)
We can wait and see how you do with your allergic rhinitis/ hayfever. I would anticipate this may begin to bother you in late March. You might want to be prepared then to start an antihistamine and/or decongestant. We can also resume a nasal spray if needed.   Ask your primary doctor about pneumonia vaccine

## 2013-01-14 NOTE — Assessment & Plan Note (Signed)
Discussed significance of improved symptoms since he stopped doing his own yard work and reduced that exposure.

## 2013-01-14 NOTE — Assessment & Plan Note (Signed)
PFT's 01/2004:  Ratio 49% Cleda Daub 07/2004:  FEV1 2.86 (71%), ratio 61  (after treatment).  Chronic nasal polyposis and he remains on aspirin without recognized aspirin allergy. Plan-he will discuss pneumonia vaccine with his primary physician

## 2013-01-14 NOTE — Assessment & Plan Note (Signed)
Chronic nasal polyposis. We again discussed nasal steroids.

## 2013-05-25 ENCOUNTER — Encounter: Payer: Self-pay | Admitting: Pulmonary Disease

## 2013-06-05 ENCOUNTER — Ambulatory Visit: Payer: Medicare Other | Admitting: Pulmonary Disease

## 2013-07-02 ENCOUNTER — Other Ambulatory Visit: Payer: Self-pay | Admitting: Pulmonary Disease

## 2013-08-31 ENCOUNTER — Ambulatory Visit (INDEPENDENT_AMBULATORY_CARE_PROVIDER_SITE_OTHER): Payer: Medicare Other | Admitting: Pulmonary Disease

## 2013-08-31 ENCOUNTER — Encounter: Payer: Self-pay | Admitting: Pulmonary Disease

## 2013-08-31 VITALS — BP 124/82 | HR 73 | Temp 98.2°F | Ht 72.0 in | Wt 274.2 lb

## 2013-08-31 DIAGNOSIS — J45909 Unspecified asthma, uncomplicated: Secondary | ICD-10-CM

## 2013-08-31 NOTE — Progress Notes (Signed)
   Subjective:    Patient ID: Arthur Liu, male    DOB: 1940-04-22, 73 y.o.   MRN: [id number]  HPI The patient comes in today for followup of his known asthma. He is been staying on his maintenance bronchodilator regimen, and has had no acute exacerbations or increased rescue inhaler use. He has been diagnosed with nasal polyposis, and is treating this conservatively. He is satisfied with his exertional tolerance at this time.   Review of Systems  Constitutional: Negative for fever and unexpected weight change.  HENT: Negative for congestion, dental problem, ear pain, nosebleeds, postnasal drip, rhinorrhea, sinus pressure, sneezing, sore throat and trouble swallowing.   Eyes: Negative for redness and itching.  Respiratory: Negative for cough, chest tightness, shortness of breath and wheezing.   Cardiovascular: Negative for palpitations and leg swelling.  Gastrointestinal: Negative for nausea and vomiting.  Genitourinary: Negative for dysuria.  Musculoskeletal: Negative for joint swelling.  Skin: Negative for rash.  Neurological: Negative for headaches.  Hematological: Does not bruise/bleed easily.  Psychiatric/Behavioral: Negative for dysphoric mood. The patient is not nervous/anxious.        Objective:   Physical Exam Overweight male in no acute distress Nose without purulence or discharge noted Neck without lymphadenopathy or thyromegaly Chest totally clear to auscultation, no wheezes Cardiac exam with regular rate and rhythm, 2/6 systolic murmur Lower extremities without edema, no cyanosis Alert and oriented, moves all 4 extremities.       Assessment & Plan:

## 2013-08-31 NOTE — Patient Instructions (Signed)
No change in your asthma medications Stay as active as possible followup with me again in one year.

## 2013-08-31 NOTE — Assessment & Plan Note (Signed)
The patient is doing very well on his current asthma regimen, with no recent exacerbations or increased rescue inhaler use. I have asked him to continue on this regimen, and to followup with me in one year if doing well.

## 2014-03-26 ENCOUNTER — Encounter: Payer: Medicare Other | Attending: Family Medicine

## 2014-03-26 DIAGNOSIS — E119 Type 2 diabetes mellitus without complications: Secondary | ICD-10-CM

## 2014-03-26 DIAGNOSIS — Z713 Dietary counseling and surveillance: Secondary | ICD-10-CM | POA: Diagnosis not present

## 2014-03-26 NOTE — Progress Notes (Signed)

## 2014-03-27 NOTE — Progress Notes (Signed)

## 2014-04-28 ENCOUNTER — Emergency Department (HOSPITAL_COMMUNITY): Payer: Medicare Other

## 2014-04-28 ENCOUNTER — Encounter (HOSPITAL_COMMUNITY): Payer: Self-pay | Admitting: Emergency Medicine

## 2014-04-28 ENCOUNTER — Emergency Department (HOSPITAL_COMMUNITY)
Admission: EM | Admit: 2014-04-28 | Discharge: 2014-04-29 | Disposition: A | Discharge status: Home / Self Care | Payer: Medicare Other | Attending: Emergency Medicine | Admitting: Emergency Medicine

## 2014-04-28 DIAGNOSIS — Z79899 Other long term (current) drug therapy: Secondary | ICD-10-CM | POA: Insufficient documentation

## 2014-04-28 DIAGNOSIS — K2970 Gastritis, unspecified, without bleeding: Secondary | ICD-10-CM | POA: Insufficient documentation

## 2014-04-28 DIAGNOSIS — Z7951 Long term (current) use of inhaled steroids: Secondary | ICD-10-CM | POA: Insufficient documentation

## 2014-04-28 DIAGNOSIS — J45909 Unspecified asthma, uncomplicated: Secondary | ICD-10-CM | POA: Insufficient documentation

## 2014-04-28 DIAGNOSIS — E119 Type 2 diabetes mellitus without complications: Secondary | ICD-10-CM | POA: Insufficient documentation

## 2014-04-28 DIAGNOSIS — R109 Unspecified abdominal pain: Secondary | ICD-10-CM

## 2014-04-28 DIAGNOSIS — Z7982 Long term (current) use of aspirin: Secondary | ICD-10-CM | POA: Insufficient documentation

## 2014-04-28 DIAGNOSIS — Z9889 Other specified postprocedural states: Secondary | ICD-10-CM | POA: Insufficient documentation

## 2014-04-28 DIAGNOSIS — E785 Hyperlipidemia, unspecified: Secondary | ICD-10-CM | POA: Insufficient documentation

## 2014-04-28 LAB — COMPREHENSIVE METABOLIC PANEL
ALBUMIN: 4 g/dL (ref 3.5–5.2)
ALT: 22 U/L (ref 0–53)
AST: 20 U/L (ref 0–37)
Alkaline Phosphatase: 64 U/L (ref 39–117)
Anion gap: 11 (ref 5–15)
BILIRUBIN TOTAL: 1.6 mg/dL — AB (ref 0.3–1.2)
BUN: 17 mg/dL (ref 6–23)
CALCIUM: 9.1 mg/dL (ref 8.4–10.5)
CHLORIDE: 103 mmol/L (ref 96–112)
CO2: 26 mmol/L (ref 19–32)
CREATININE: 0.94 mg/dL (ref 0.50–1.35)
GFR, EST NON AFRICAN AMERICAN: 82 mL/min — AB (ref >90)
GLUCOSE: 130 mg/dL — AB (ref 70–99)
Potassium: 4.1 mmol/L (ref 3.5–5.1)
Sodium: 140 mmol/L (ref 135–145)
TOTAL PROTEIN: 7.1 g/dL (ref 6.0–8.3)

## 2014-04-28 LAB — CBC WITH DIFFERENTIAL/PLATELET
Basophils Absolute: 0 10*3/uL (ref 0.0–0.1)
Basophils Relative: 0 % (ref 0–1)
Eosinophils Absolute: 0.1 10*3/uL (ref 0.0–0.7)
Eosinophils Relative: 1 % (ref 0–5)
HCT: 49.5 % (ref 39.0–52.0)
Hemoglobin: 16.3 g/dL (ref 13.0–17.0)
LYMPHS ABS: 1.5 10*3/uL (ref 0.7–4.0)
Lymphocytes Relative: 19 % (ref 12–46)
MCH: 31.2 pg (ref 26.0–34.0)
MCHC: 32.9 g/dL (ref 30.0–36.0)
MCV: 94.6 fL (ref 78.0–100.0)
MONOS PCT: 6 % (ref 3–12)
Monocytes Absolute: 0.5 10*3/uL (ref 0.1–1.0)
Neutro Abs: 6.1 10*3/uL (ref 1.7–7.7)
Neutrophils Relative %: 74 % (ref 43–77)
Platelets: 236 10*3/uL (ref 150–400)
RBC: 5.23 MIL/uL (ref 4.22–5.81)
RDW: 13.8 % (ref 11.5–15.5)
WBC: 8.2 10*3/uL (ref 4.0–10.5)

## 2014-04-28 LAB — URINALYSIS, ROUTINE W REFLEX MICROSCOPIC
BILIRUBIN URINE: NEGATIVE
Glucose, UA: NEGATIVE mg/dL
HGB URINE DIPSTICK: NEGATIVE
Ketones, ur: 15 mg/dL — AB
LEUKOCYTES UA: NEGATIVE
Nitrite: NEGATIVE
Protein, ur: NEGATIVE mg/dL
Specific Gravity, Urine: 1.022 (ref 1.005–1.030)
Urobilinogen, UA: 1 mg/dL (ref 0.0–1.0)
pH: 5.5 (ref 5.0–8.0)

## 2014-04-28 LAB — LIPASE, BLOOD: Lipase: 13 U/L (ref 11–59)

## 2014-04-28 MED ORDER — IOHEXOL 300 MG/ML  SOLN
50.0000 mL | Freq: Once | INTRAMUSCULAR | Status: AC | PRN
  Administered 2014-04-28: 50 mL via ORAL

## 2014-04-28 MED ORDER — IOHEXOL 300 MG/ML  SOLN
100.0000 mL | Freq: Once | INTRAMUSCULAR | Status: AC | PRN
  Administered 2014-04-28: 100 mL via INTRAVENOUS

## 2014-04-28 MED ORDER — SODIUM CHLORIDE 0.9 % IV BOLUS (SEPSIS)
1000.0000 mL | Freq: Once | INTRAVENOUS | Status: AC
  Administered 2014-04-28: 1000 mL via INTRAVENOUS

## 2014-04-28 NOTE — Discharge Instructions (Signed)
Please call your doctor for a followup appointment within 24-48 hours. When you talk to your doctor please let them know that you were seen in the emergency department and have them acquire all of your records so that they can discuss the findings with you and formulate a treatment plan to fully care for your new and ongoing problems. Please follow-up with primary care provider Please avoid any physical or strenuous activity Please rest and stay hydrated Please stay on clear liquid diet for the next couple of days Please continue to monitor symptoms closely and if symptoms are to worsen or change (fever greater than 101, chills, sweating, nausea, vomiting, chest pain, shortness of breathe, difficulty breathing, weakness, numbness, tingling, worsening or changes to pain pattern, increased distention of the abdomen, increased tenderness, decreased passage of gas, decreased bowel movements, inability to food or fluids down, blood in stools, black tarry stools) please report back to the Emergency Department immediately.   Clear Liquid Diet A clear liquid diet is a short-term diet that is prescribed to provide the necessary fluid and basic energy you need when you can have nothing else. The clear liquid diet consists of liquids or solids that will become liquid at room temperature. You should be able to see through the liquid. There are many reasons that you may be restricted to clear liquids, such as:  When you have a sudden-onset (acute) condition that occurs before or after surgery.  To help your body slowly get adjusted to food again after a long period when you were unable to have food.  Replacement of fluids when you have a diarrheal disease.  When you are going to have certain exams, such as a colonoscopy, in which instruments are inserted inside your body to look at parts of your digestive system. WHAT CAN I HAVE? A clear liquid diet does not provide all the nutrients you need. It is important  to choose a variety of the following items to get as many nutrients as possible:  Vegetable juices that do not have pulp.  Fruit juices and fruit drinks that do not have pulp.  Coffee (regular or decaffeinated), tea, or soda at the discretion of your health care provider.  Clear bouillon, broth, or strained broth-based soups.  High-protein and flavored gelatins.  Sugar or honey.  Ices or frozen ice pops that do not contain milk. If you are not sure whether you can have certain items, you should ask your health care provider. You may also ask your health care provider if there are any other clear liquid options. Document Released: 01/11/2005 Document Revised: 01/16/2013 Document Reviewed: 12/08/2012 [facility] Patient Information 2015 [place], [place]. This information is not intended to replace advice given to you by your health care provider. Make sure you discuss any questions you have with your health care provider.  Gastritis, Adult Gastritis is soreness and swelling (inflammation) of the lining of the stomach. Gastritis can develop as a sudden onset (acute) or long-term (chronic) condition. If gastritis is not treated, it can lead to stomach bleeding and ulcers. CAUSES  Gastritis occurs when the stomach lining is weak or damaged. Digestive juices from the stomach then inflame the weakened stomach lining. The stomach lining may be weak or damaged due to viral or bacterial infections. One common bacterial infection is the Helicobacter pylori infection. Gastritis can also result from excessive alcohol consumption, taking certain medicines, or having too much acid in the stomach.  SYMPTOMS  In some cases, there are no symptoms. When  symptoms are present, they may include:  Pain or a burning sensation in the upper abdomen.  Nausea.  Vomiting.  An uncomfortable feeling of fullness after eating. DIAGNOSIS  Your caregiver may suspect you have gastritis based on your symptoms and a  physical exam. To determine the cause of your gastritis, your caregiver may perform the following:  Blood or stool tests to check for the H pylori bacterium.  Gastroscopy. A thin, flexible tube (endoscope) is passed down the esophagus and into the stomach. The endoscope has a light and camera on the end. Your caregiver uses the endoscope to view the inside of the stomach.  Taking a tissue sample (biopsy) from the stomach to examine under a microscope. TREATMENT  Depending on the cause of your gastritis, medicines may be prescribed. If you have a bacterial infection, such as an H pylori infection, antibiotics may be given. If your gastritis is caused by too much acid in the stomach, H2 blockers or antacids may be given. Your caregiver may recommend that you stop taking aspirin, ibuprofen, or other nonsteroidal anti-inflammatory drugs (NSAIDs). HOME CARE INSTRUCTIONS  Only take over-the-counter or prescription medicines as directed by your caregiver.  If you were given antibiotic medicines, take them as directed. Finish them even if you start to feel better.  Drink enough fluids to keep your urine clear or pale yellow.  Avoid foods and drinks that make your symptoms worse, such as:  Caffeine or alcoholic drinks.  Chocolate.  Peppermint or mint flavorings.  Garlic and onions.  Spicy foods.  Citrus fruits, such as oranges, lemons, or limes.  Tomato-based foods such as sauce, chili, salsa, and pizza.  Fried and fatty foods.  Eat small, frequent meals instead of large meals. SEEK IMMEDIATE MEDICAL CARE IF:   You have black or dark red stools.  You vomit blood or material that looks like coffee grounds.  You are unable to keep fluids down.  Your abdominal pain gets worse.  You have a fever.  You do not feel better after 1 week.  You have any other questions or concerns. MAKE SURE YOU:  Understand these instructions.  Will watch your condition.  Will get help right  away if you are not doing well or get worse. Document Released: 01/05/2001 Document Revised: 07/13/2011 Document Reviewed: 02/24/2011 [facility] Patient Information 2015 [place]. This information is not intended to replace advice given to you by your health care provider. Make sure you discuss any questions you have with your health care provider.

## 2014-04-28 NOTE — ED Notes (Signed)
Yesterday started having abdominal pain and distention with a very firm abdomin, increasing pain into today. Hx of abdominal obstruction and abdominal surgery.

## 2014-04-28 NOTE — ED Notes (Signed)
Pt aware urine sample is needed, urinal given 

## 2014-04-28 NOTE — ED Notes (Signed)
Patient tolerates liquids without any N/V. No other adverse events noted.

## 2014-04-28 NOTE — ED Provider Notes (Signed)
CSN: [id number]     Arrival date & time 04/28/14  1612 History   First MD Initiated Contact with Patient 04/28/14 1628     Chief Complaint  Patient presents with  . Abdominal Pain     (Consider location/radiation/quality/duration/timing/severity/associated sxs/prior Treatment) The history is provided by the patient. No language interpreter was used.  Arthur Liu is a 74 y/o M with PMHx of asthma, allergies, DM, HLD, abdominal exploration in 2010 secondary to SBO presenting to the ED with abdominal distension that started yesterday. Patient reported that he was not feeling well yesterday, stated that his stomach was bothering him. Stated that when he woke up this morning he started to have increased abdominal pain localized to the periumbilical region. Stated that since yesterday patient has noticed increase in abdominal distension. Stated that he had a normal BM this morning, but stated that he has not been able to pass gas. Reported that he has been eating without difficulty. Reported that he has had history of SBO in the past and knows the symptoms. Denied fever, chills, melena, hematochezia, diarrhea, nausea, vomiting, chest pain, shortness of breath, difficulty breathing, headache, dizziness, hematuria, dysuria, urinary symptoms. PCP Dr. Clarene Duke   Past Medical History  Diagnosis Date  . Asthma   . Allergic rhinitis   . Diabetes mellitus without complication   . Hyperlipidemia    Past Surgical History  Procedure Laterality Date  . Abdominal exploration surgery  2010    found blockage on cxr---surgery showed nothing  . Cyst excision  1966   Family History  Problem Relation Age of Onset  . Heart attack Mother   . Heart attack Father   . Heart disease Brother   . Prostate cancer Brother    History  Substance Use Topics  . Smoking status: Never Smoker   . Smokeless tobacco: Not on file  . Alcohol Use: Yes     Comment: occassional//rare-about 2 glasses of white wine a month     Review of Systems  Constitutional: Negative for fever and chills.  Respiratory: Negative for chest tightness and shortness of breath.   Cardiovascular: Negative for chest pain.  Gastrointestinal: Positive for abdominal pain and abdominal distention. Negative for nausea, vomiting, diarrhea, constipation, blood in stool and anal bleeding.  Genitourinary: Negative for dysuria, hematuria and decreased urine volume.  Musculoskeletal: Negative for back pain, neck pain and neck stiffness.  Neurological: Negative for dizziness, weakness and headaches.      Allergies  Review of patient's allergies indicates no known allergies.  Home Medications   Prior to Admission medications   Medication Sig Start Date End Date Taking? Authorizing Provider  acetaminophen (TYLENOL) 500 MG tablet Take 1,000 mg by mouth every 6 (six) hours as needed for mild pain or headache.   Yes Historical Provider, MD  albuterol (PROVENTIL HFA;VENTOLIN HFA) 108 (90 BASE) MCG/ACT inhaler Inhale 2 puffs into the lungs every 6 (six) hours as needed for wheezing. 06/05/12  Yes Barbaraann Share, MD  aspirin EC 81 MG tablet Take 81 mg by mouth daily.   Yes Historical Provider, MD  Fluticasone-Salmeterol (ADVAIR) 250-50 MCG/DOSE AEPB Inhale 1 puff into the lungs daily. 07/02/13  Yes Barbaraann Share, MD  rosuvastatin (CRESTOR) 20 MG tablet Take 20 mg by mouth daily.   Yes Historical Provider, MD   BP 131/71 mmHg  Pulse 98  Temp(Src) 97.6 F (36.4 C) (Oral)  Resp 16  SpO2 94% Physical Exam  Constitutional: He is oriented to person, place, and time.  He appears well-developed and well-nourished. No distress.  HENT:  Head: Normocephalic and atraumatic.  Mouth/Throat: Oropharynx is clear and moist. No oropharyngeal exudate.  Eyes: Conjunctivae and EOM are normal. Right eye exhibits no discharge. Left eye exhibits no discharge.  Neck: Normal range of motion. Neck supple.  Cardiovascular: Normal rate, regular rhythm and normal heart  sounds.  Exam reveals no friction rub.   No murmur heard. Pulmonary/Chest: Effort normal and breath sounds normal. No respiratory distress. He has no wheezes. He has no rales.  Abdominal: Soft. Bowel sounds are normal. He exhibits distension. There is tenderness in the epigastric area and periumbilical area. There is no rebound, no guarding and no CVA tenderness.  Appears a periumbilical hernia palpated to the abdomen - does not appear to be incarcerated.   Musculoskeletal: Normal range of motion.  Neurological: He is alert and oriented to person, place, and time. No cranial nerve deficit. He exhibits normal muscle tone. Coordination normal.  Skin: Skin is warm and dry. No rash noted. He is not diaphoretic. No erythema.  Psychiatric: He has a normal mood and affect. His behavior is normal. Thought content normal.  Nursing note and vitals reviewed.   ED Course  Procedures (including critical care time)  Results for orders placed or performed during the hospital encounter of 04/28/14  CBC with Differential/Platelet  Result Value Ref Range   WBC 8.2 4.0 - 10.5 K/uL   RBC 5.23 4.22 - 5.81 MIL/uL   Hemoglobin 16.3 13.0 - 17.0 g/dL   HCT 16.1 09.6 - 04.5 %   MCV 94.6 78.0 - 100.0 fL   MCH 31.2 26.0 - 34.0 pg   MCHC 32.9 30.0 - 36.0 g/dL   RDW 40.9 81.1 - 91.4 %   Platelets 236 150 - 400 K/uL   Neutrophils Relative % 74 43 - 77 %   Neutro Abs 6.1 1.7 - 7.7 K/uL   Lymphocytes Relative 19 12 - 46 %   Lymphs Abs 1.5 0.7 - 4.0 K/uL   Monocytes Relative 6 3 - 12 %   Monocytes Absolute 0.5 0.1 - 1.0 K/uL   Eosinophils Relative 1 0 - 5 %   Eosinophils Absolute 0.1 0.0 - 0.7 K/uL   Basophils Relative 0 0 - 1 %   Basophils Absolute 0.0 0.0 - 0.1 K/uL  Comprehensive metabolic panel  Result Value Ref Range   Sodium 140 135 - 145 mmol/L   Potassium 4.1 3.5 - 5.1 mmol/L   Chloride 103 96 - 112 mmol/L   CO2 26 19 - 32 mmol/L   Glucose, Bld 130 (H) 70 - 99 mg/dL   BUN 17 6 - 23 mg/dL    Creatinine, Ser 7.82 0.50 - 1.35 mg/dL   Calcium 9.1 8.4 - 95.6 mg/dL   Total Protein 7.1 6.0 - 8.3 g/dL   Albumin 4.0 3.5 - 5.2 g/dL   AST 20 0 - 37 U/L   ALT 22 0 - 53 U/L   Alkaline Phosphatase 64 39 - 117 U/L   Total Bilirubin 1.6 (H) 0.3 - 1.2 mg/dL   GFR calc non Af Amer 82 (L) >90 mL/min   GFR calc Af Amer >90 >90 mL/min   Anion gap 11 5 - 15  Lipase, blood  Result Value Ref Range   Lipase 13 11 - 59 U/L  Urinalysis, Routine w reflex microscopic  Result Value Ref Range   Color, Urine YELLOW YELLOW   APPearance CLEAR CLEAR   Specific Gravity, Urine 1.022 1.005 -  1.030   pH 5.5 5.0 - 8.0   Glucose, UA NEGATIVE NEGATIVE mg/dL   Hgb urine dipstick NEGATIVE NEGATIVE   Bilirubin Urine NEGATIVE NEGATIVE   Ketones, ur 15 (A) NEGATIVE mg/dL   Protein, ur NEGATIVE NEGATIVE mg/dL   Urobilinogen, UA 1.0 0.0 - 1.0 mg/dL   Nitrite NEGATIVE NEGATIVE   Leukocytes, UA NEGATIVE NEGATIVE    Labs Review Labs Reviewed  COMPREHENSIVE METABOLIC PANEL - Abnormal; Notable for the following:    Glucose, Bld 130 (*)    Total Bilirubin 1.6 (*)    GFR calc non Af Amer 82 (*)    All other components within normal limits  URINALYSIS, ROUTINE W REFLEX MICROSCOPIC - Abnormal; Notable for the following:    Ketones, ur 15 (*)    All other components within normal limits  CBC WITH DIFFERENTIAL/PLATELET  LIPASE, BLOOD    Imaging Review Ct Abdomen Pelvis W Contrast  04/28/2014   CLINICAL DATA:  Abdominal pain and distention. Firm abdomen. Radiographs suggest small bowel obstruction.  EXAM: CT ABDOMEN AND PELVIS WITH CONTRAST  TECHNIQUE: Multidetector CT imaging of the abdomen and pelvis was performed using the standard protocol following bolus administration of intravenous contrast.  CONTRAST:  50mL OMNIPAQUE IOHEXOL 300 MG/ML SOLN, 100mL OMNIPAQUE IOHEXOL 300 MG/ML SOLN  COMPARISON:  Multiple exams, including 11/24/2007 and 04/28/2014  FINDINGS: Lower chest:  Unremarkable  Hepatobiliary: Unremarkable   Pancreas: Unremarkable  Spleen: Unremarkable  Adrenals/Urinary Tract: 6.9 by 3.0 cm exophytic cyst of the right mid kidney posteriorly.  Stomach/Bowel: The proximal jejunum is not dilated but most of the remaining loops of small bowel are dilated with progressive dilution a of the contrast column, small bowel column per up to 4.3 cm, and scattered air-fluid levels in the small bowel. Nondistended large bowel and nondistended terminal ileum but with slow and gradual dilatation of the ileum extending proximally without an obvious lead point for obstruction and without a well-defined angulated loop of bowel. Low-grade mesenteric edema and scattered ascites noted along the loops of small bowel.  Vascular/Lymphatic: The celiac trunk, SMA, and IMA appear patent. There is some mild aortoiliac atherosclerotic vascular disease.  Reproductive: Unremarkable  Other: Scattered small pockets ascites. Rim calcified structure is marginal to one of the pelvic collections of ascites on image 79 of series 2 measures 0.9 cm in diameter, and is unchanged imposition from 10/25/2007.  Musculoskeletal: Mild sclerosis along both sacroiliac joints. Lumbar spondylosis and degenerative disc disease with foraminal impingement at L3-4 and L4-5. Endplate sclerosis is specially at L2-3. A small umbilical hernia contains adipose tissue but not bowel.  IMPRESSION: 1. Although the appearance of air- fluid levels in dilated loops of small bowel with progressive contrast column dilution suggest small bowel obstruction, I do not see a lead point for obstruction. There is gradual tapering of the ileum but again without an obvious cause or focal lead point for the appearance which would otherwise suggest small bowel obstruction. By report the patient had exploratory laparotomy in 2010 which not reveal a site of expected small bowel obstruction ; the overall appearance today is fairly similar to how it was in 11/24/2007. I do not see compelling signs of  malrotation or internal hernia. 2. Scattered small amount of ascites and mild and low-grade mesenteric edema. No filling defect in the mesenteric vasculature identified. There is only minimal aortoiliac atherosclerosis. 3. Lumbar spondylosis and degenerative disc disease cause foraminal impingement at L3-4 and L4-5.   Electronically Signed   By: Gaylyn RongWalter  Liebkemann  M.D.   On: 04/28/2014 19:22   Dg Abd Acute W/chest  04/28/2014   CLINICAL DATA:  Abdominal pain and distention. Firmness the abdomen.  EXAM: ACUTE ABDOMEN SERIES (ABDOMEN 2 VIEW & CHEST 1 VIEW)  COMPARISON:  10/18/2010 ; 11/23/2007  FINDINGS: Mild atherosclerotic calcification of the aortic arch. The lungs appear clear. Heart size within normal limits.  Scattered air-fluid levels are present in dilated small bowel loops up to 5.5 cm in diameter, with fluid levels at differing vertical heights in loops favoring small bowel obstruction.  Gas and stool are present in the colon. Lumbar spondylosis and degenerative disc disease.  IMPRESSION: 1. Radiographic findings characteristic of small bowel obstruction, with dilated loops with air-fluid levels, and the air- fluid levels and differing vertical height within individual loops.   Electronically Signed   By: Gaylyn Rong M.D.   On: 04/28/2014 17:26     EKG Interpretation None       9:28 PM This provider spoke with Dr. Daphine Deutscher, [facility] General Surgery - discussed case, labs, imaging, ED course in great detail. Discussed CT abdomen and pelvis with contrast in great detail. Physician to go and assess patient.  10:18 PM Discussed case with Dr. Daphine Deutscher, [facility] General Surgery. As per physician, reported that this may be an ileus from gastroenteritis. Reported that this is not a surgical abdomen. Reported that patient can be hydrated here and if patient can tolerate fluids PO without difficulty patient can be discharged home.   11:43 PM Dr. Abran Duke spoke with patient and wife - reported clear for  discharge.   MDM   Final diagnoses:  Abdominal pain, unspecified abdominal location  Gastritis    Medications  iohexol (OMNIPAQUE) 300 MG/ML solution 50 mL (50 mLs Oral Contrast Given 04/28/14 1737)  sodium chloride 0.9 % bolus 1,000 mL (0 mLs Intravenous Stopped 04/28/14 1951)  iohexol (OMNIPAQUE) 300 MG/ML solution 100 mL (100 mLs Intravenous Contrast Given 04/28/14 1850)    Filed Vitals:   04/28/14 1624 04/28/14 1815 04/28/14 2031 04/28/14 2334  BP: 161/83 153/78 143/83 131/71  Pulse: 99 93 98 98  Temp: 97.6 F (36.4 C)     TempSrc: Oral     Resp: SpO2: 96% 99% 97% 94%    CBC negative elevated leukocytosis. Hemoglobin 16.3, hematocrit 49.5. CMP unremarkable. Lipase negative elevation. Urinalysis unremarkable-negative findings of hemoglobin, nitrites, leukocytes-negative findings of infection. Acute abdomen with chest noted rated graphic findings consistent with small bowel obstruction-dilated loops with air-fluid levels in the air-fluid levels differ in vertical height with individual loops. CT abdomen and pelvis with contrast noted gradual tapering of the ileum but no obvious cause her focal lead point for. Second otherwise suggest small bowel obstruction. Scattered small amount of ascites and mild and low grade mesenteric edema-no filling defect in the mesenteric vasculature identified. Minimal aortoiliac atherosclerosis. Discussed case and imaging in great detail with Dr. Daphine Deutscher, [facility] surgeon. Dr. Daphine Deutscher reviewed patient's CT abdomen and pelvis with contrast great detail. Reported that this is not a SBO, reported this is most likely due to gastritis. Reported that patient is able to stay hydrated and keep fluids down, patient can be discharged home. Patient tolerated fluids by mouth without difficulty - patient has been monitored in ED setting for at least 7 hours. Negative episodes of emesis in ED setting. Patient seen and assessed by attending physician, Dr.  Jodi Mourning. Patient stable, afebrile. Patient not septic appearing. Negative signs of respiratory distress. Offered admission to  patient if he feels more comfortable to stay in the hospital for monitoring and for IV hydration-wife and patient preferred to be discharged home. Discharged patient. Discussed with patient to rest and stay hydrated. Discussed with patient proper liquid diet. Discussed with patient to closely monitor symptoms and if symptoms are to worsen or change to report back to the ED - strict return instructions given.  Patient agreed to plan of care, understood, all questions answered.   Raymon Mutton, PA-C 04/28/14 2344  Blane Ohara, MD 04/29/14 0001

## 2014-04-30 ENCOUNTER — Ambulatory Visit: Payer: Self-pay

## 2014-04-30 ENCOUNTER — Inpatient Hospital Stay (HOSPITAL_COMMUNITY)
Admission: EM | Admit: 2014-04-30 | Discharge: 2014-05-02 | DRG: 390 | Disposition: A | Discharge status: Home / Self Care | Payer: Medicare Other | Attending: General Surgery | Admitting: General Surgery

## 2014-04-30 ENCOUNTER — Encounter (HOSPITAL_COMMUNITY): Payer: Self-pay

## 2014-04-30 ENCOUNTER — Emergency Department (HOSPITAL_COMMUNITY): Payer: Medicare Other

## 2014-04-30 DIAGNOSIS — R109 Unspecified abdominal pain: Secondary | ICD-10-CM | POA: Diagnosis not present

## 2014-04-30 DIAGNOSIS — Z7982 Long term (current) use of aspirin: Secondary | ICD-10-CM

## 2014-04-30 DIAGNOSIS — E119 Type 2 diabetes mellitus without complications: Secondary | ICD-10-CM | POA: Diagnosis present

## 2014-04-30 DIAGNOSIS — K56609 Unspecified intestinal obstruction, unspecified as to partial versus complete obstruction: Secondary | ICD-10-CM | POA: Diagnosis present

## 2014-04-30 DIAGNOSIS — K5660 Unspecified intestinal obstruction: Principal | ICD-10-CM | POA: Diagnosis present

## 2014-04-30 DIAGNOSIS — Z79899 Other long term (current) drug therapy: Secondary | ICD-10-CM

## 2014-04-30 DIAGNOSIS — J45909 Unspecified asthma, uncomplicated: Secondary | ICD-10-CM | POA: Diagnosis present

## 2014-04-30 DIAGNOSIS — E785 Hyperlipidemia, unspecified: Secondary | ICD-10-CM | POA: Diagnosis present

## 2014-04-30 LAB — BASIC METABOLIC PANEL
Anion gap: 14 (ref 5–15)
BUN: 18 mg/dL (ref 6–23)
CALCIUM: 9.2 mg/dL (ref 8.4–10.5)
CO2: 25 mmol/L (ref 19–32)
Chloride: 99 mmol/L (ref 96–112)
Creatinine, Ser: 1.02 mg/dL (ref 0.50–1.35)
GFR calc Af Amer: 83 mL/min — ABNORMAL LOW (ref >90)
GFR, EST NON AFRICAN AMERICAN: 71 mL/min — AB (ref >90)
GLUCOSE: 117 mg/dL — AB (ref 70–99)
POTASSIUM: 4.5 mmol/L (ref 3.5–5.1)
SODIUM: 138 mmol/L (ref 135–145)

## 2014-04-30 LAB — CBC WITH DIFFERENTIAL/PLATELET
BASOS PCT: 1 % (ref 0–1)
Basophils Absolute: 0 10*3/uL (ref 0.0–0.1)
Eosinophils Absolute: 0.2 10*3/uL (ref 0.0–0.7)
Eosinophils Relative: 2 % (ref 0–5)
HCT: 50.1 % (ref 39.0–52.0)
Hemoglobin: 16.5 g/dL (ref 13.0–17.0)
LYMPHS PCT: 28 % (ref 12–46)
Lymphs Abs: 2.1 10*3/uL (ref 0.7–4.0)
MCH: 30.8 pg (ref 26.0–34.0)
MCHC: 32.9 g/dL (ref 30.0–36.0)
MCV: 93.5 fL (ref 78.0–100.0)
Monocytes Absolute: 0.8 10*3/uL (ref 0.1–1.0)
Monocytes Relative: 12 % (ref 3–12)
NEUTROS ABS: 4.1 10*3/uL (ref 1.7–7.7)
Neutrophils Relative %: 57 % (ref 43–77)
PLATELETS: 271 10*3/uL (ref 150–400)
RBC: 5.36 MIL/uL (ref 4.22–5.81)
RDW: 13.6 % (ref 11.5–15.5)
WBC: 7.2 10*3/uL (ref 4.0–10.5)

## 2014-04-30 LAB — I-STAT CG4 LACTIC ACID, ED: LACTIC ACID, VENOUS: 1.88 mmol/L (ref 0.5–2.0)

## 2014-04-30 MED ORDER — PANTOPRAZOLE SODIUM 40 MG IV SOLR
40.0000 mg | Freq: Every day | INTRAVENOUS | Status: DC
  Administered 2014-04-30 – 2014-05-01 (×2): 40 mg via INTRAVENOUS
  Filled 2014-04-30 (×3): qty 40

## 2014-04-30 MED ORDER — ALBUTEROL SULFATE (2.5 MG/3ML) 0.083% IN NEBU: 3.0000 mL | INHALATION_SOLUTION | Freq: Four times a day (QID) | RESPIRATORY_TRACT | Status: DC | PRN

## 2014-04-30 MED ORDER — ONDANSETRON HCL 4 MG/2ML IJ SOLN: 4.0000 mg | Freq: Four times a day (QID) | INTRAMUSCULAR | Status: DC | PRN

## 2014-04-30 MED ORDER — ONDANSETRON HCL 4 MG/2ML IJ SOLN
4.0000 mg | Freq: Once | INTRAMUSCULAR | Status: DC
  Filled 2014-04-30: qty 2

## 2014-04-30 MED ORDER — MORPHINE SULFATE 2 MG/ML IJ SOLN: 1.0000 mg | INTRAMUSCULAR | Status: DC | PRN

## 2014-04-30 MED ORDER — HEPARIN SODIUM (PORCINE) 5000 UNIT/ML IJ SOLN
5000.0000 [IU] | Freq: Three times a day (TID) | INTRAMUSCULAR | Status: DC
  Administered 2014-04-30 – 2014-05-02 (×7): 5000 [IU] via SUBCUTANEOUS
  Filled 2014-04-30 (×9): qty 1

## 2014-04-30 MED ORDER — MOMETASONE FURO-FORMOTEROL FUM 100-5 MCG/ACT IN AERO
2.0000 | INHALATION_SPRAY | Freq: Two times a day (BID) | RESPIRATORY_TRACT | Status: DC
  Administered 2014-04-30 – 2014-05-01 (×2): 2 via RESPIRATORY_TRACT
  Filled 2014-04-30: qty 8.8

## 2014-04-30 MED ORDER — SODIUM CHLORIDE 0.9 % IV BOLUS (SEPSIS)
1000.0000 mL | Freq: Once | INTRAVENOUS | Status: AC
  Administered 2014-04-30: 1000 mL via INTRAVENOUS

## 2014-04-30 MED ORDER — KCL IN DEXTROSE-NACL 20-5-0.9 MEQ/L-%-% IV SOLN
INTRAVENOUS | Status: DC
  Administered 2014-04-30 – 2014-05-01 (×3): via INTRAVENOUS
  Filled 2014-04-30 (×4): qty 1000

## 2014-04-30 NOTE — H&P (Signed)
Arthur Liu is an 74 y.o. male.   Chief Complaint: vomiting HPI: The pt is a 74 yo wm who presents with intermittent abdominal pain centrally since Saturday. It has been associated with an episode or 2 of nausea and vomiting. He has passed flatus today and had normal bm's this am and yesterday. No diarrhea. No fever. His granddaughter has been sick with a GI bug. He had a similar episode of this in 2010 that looked like a sbo. He underwent surgery in [place] and they found nothing. His current xray shows a dilated loop of small bowel and the contrast he drank 2 days ago is in his colon and rectum.  Past Medical History  Diagnosis Date  . Asthma   . Allergic rhinitis   . Diabetes mellitus without complication   . Hyperlipidemia     Past Surgical History  Procedure Laterality Date  . Abdominal exploration surgery  2010    found blockage on cxr---surgery showed nothing  . Cyst excision  1966    Family History  Problem Relation Age of Onset  . Heart attack Mother   . Heart attack Father   . Heart disease Brother   . Prostate cancer Brother    Social History:  reports that he has never smoked. He does not have any smokeless tobacco history on file. He reports that he drinks alcohol. He reports that he does not use illicit drugs.  Allergies: No Known Allergies   (Not in a hospital admission)  Results for orders placed or performed during the hospital encounter of 04/30/14 (from the past 48 hour(s))  Basic metabolic panel     Status: Abnormal   Collection Time: 04/30/14 10:30 AM  Result Value Ref Range   Sodium 138 135 - 145 mmol/L   Potassium 4.5 3.5 - 5.1 mmol/L   Chloride 99 96 - 112 mmol/L   CO2 25 19 - 32 mmol/L   Glucose, Bld 117 (H) 70 - 99 mg/dL   BUN 18 6 - 23 mg/dL   Creatinine, Ser 1.02 0.50 - 1.35 mg/dL   Calcium 9.2 8.4 - 10.5 mg/dL   GFR calc non Af Amer 71 (L) >90 mL/min   GFR calc Af Amer 83 (L) >90 mL/min    Comment: (NOTE) The eGFR has been calculated  using the CKD EPI equation. This calculation has not been validated in all clinical situations. eGFR's persistently <90 mL/min signify possible Chronic Kidney Disease.    Anion gap 14 5 - 15  CBC with Differential/Platelet     Status: None   Collection Time: 04/30/14 10:30 AM  Result Value Ref Range   WBC 7.2 4.0 - 10.5 K/uL   RBC 5.36 4.22 - 5.81 MIL/uL   Hemoglobin 16.5 13.0 - 17.0 g/dL   HCT 50.1 39.0 - 52.0 %   MCV 93.5 78.0 - 100.0 fL   MCH 30.8 26.0 - 34.0 pg   MCHC 32.9 30.0 - 36.0 g/dL   RDW 13.6 11.5 - 15.5 %   Platelets 271 150 - 400 K/uL   Neutrophils Relative % 57 43 - 77 %   Neutro Abs 4.1 1.7 - 7.7 K/uL   Lymphocytes Relative 28 12 - 46 %   Lymphs Abs 2.1 0.7 - 4.0 K/uL   Monocytes Relative 12 3 - 12 %   Monocytes Absolute 0.8 0.1 - 1.0 K/uL   Eosinophils Relative 2 0 - 5 %   Eosinophils Absolute 0.2 0.0 - 0.7 K/uL   Basophils  Relative 1 0 - 1 %   Basophils Absolute 0.0 0.0 - 0.1 K/uL  I-Stat CG4 Lactic Acid, ED     Status: None   Collection Time: 04/30/14 10:35 AM  Result Value Ref Range   Lactic Acid, Venous 1.88 0.5 - 2.0 mmol/L   Ct Abdomen Pelvis W Contrast  04/28/2014   CLINICAL DATA:  Abdominal pain and distention. Firm abdomen. Radiographs suggest small bowel obstruction.  EXAM: CT ABDOMEN AND PELVIS WITH CONTRAST  TECHNIQUE: Multidetector CT imaging of the abdomen and pelvis was performed using the standard protocol following bolus administration of intravenous contrast.  CONTRAST:  46m OMNIPAQUE IOHEXOL 300 MG/ML SOLN, 1053mOMNIPAQUE IOHEXOL 300 MG/ML SOLN  COMPARISON:  Multiple exams, including 11/24/2007 and 04/28/2014  FINDINGS: Lower chest:  Unremarkable  Hepatobiliary: Unremarkable  Pancreas: Unremarkable  Spleen: Unremarkable  Adrenals/Urinary Tract: 6.9 by 3.0 cm exophytic cyst of the right mid kidney posteriorly.  Stomach/Bowel: The proximal jejunum is not dilated but most of the remaining loops of small bowel are dilated with progressive dilution a  of the contrast column, small bowel column per up to 4.3 cm, and scattered air-fluid levels in the small bowel. Nondistended large bowel and nondistended terminal ileum but with slow and gradual dilatation of the ileum extending proximally without an obvious lead point for obstruction and without a well-defined angulated loop of bowel. Low-grade mesenteric edema and scattered ascites noted along the loops of small bowel.  Vascular/Lymphatic: The celiac trunk, SMA, and IMA appear patent. There is some mild aortoiliac atherosclerotic vascular disease.  Reproductive: Unremarkable  Other: Scattered small pockets ascites. Rim calcified structure is marginal to one of the pelvic collections of ascites on image 79 of series 2 measures 0.9 cm in diameter, and is unchanged imposition from 10/25/2007.  Musculoskeletal: Mild sclerosis along both sacroiliac joints. Lumbar spondylosis and degenerative disc disease with foraminal impingement at L3-4 and L4-5. Endplate sclerosis is specially at L2-3. A small umbilical hernia contains adipose tissue but not bowel.  IMPRESSION: 1. Although the appearance of air- fluid levels in dilated loops of small bowel with progressive contrast column dilution suggest small bowel obstruction, I do not see a lead point for obstruction. There is gradual tapering of the ileum but again without an obvious cause or focal lead point for the appearance which would otherwise suggest small bowel obstruction. By report the patient had exploratory laparotomy in 2010 which not reveal a site of expected small bowel obstruction ; the overall appearance today is fairly similar to how it was in 11/24/2007. I do not see compelling signs of malrotation or internal hernia. 2. Scattered small amount of ascites and mild and low-grade mesenteric edema. No filling defect in the mesenteric vasculature identified. There is only minimal aortoiliac atherosclerosis. 3. Lumbar spondylosis and degenerative disc disease cause  foraminal impingement at L3-4 and L4-5.   Electronically Signed   By: WaVan Clines.D.   On: 04/28/2014 19:22   Dg Abd Acute W/chest  04/30/2014   CLINICAL DATA:  Nausea and vomiting since Sunday, had abdominal pain, with seen in Emergency Department on Sunday, history asthma, diabetes  EXAM: ACUTE ABDOMEN SERIES (ABDOMEN 2 VIEW & CHEST 1 VIEW)  COMPARISON:  04/28/2014; correlation CT abdomen and pelvis 04/28/2014  FINDINGS: Normal heart size, mediastinal contours, and pulmonary vascularity.  Lungs clear.  No pleural effusion or pneumothorax.  Retained contrast in nondistended colon to rectum.  Dilated small bowel loops in mid abdomen and LEFT upper quadrant out of proportion to  colonic diameter compatible with small bowel obstruction.  Largest small bowel loop measures 6.2 cm transverse.  No definite bowel wall thickening or free intraperitoneal air.  Degenerative disc disease changes lumbar spine.  No definite urinary tract calcification.  IMPRESSION: Dilated small bowel loops consistent with small bowel obstruction.  Contrast from prior CT exam of 04/28/2014 is present within a decompressed colon indicating small bowel obstruction is partial.   Electronically Signed   By: Lavonia Dana M.D.   On: 04/30/2014 11:22   Dg Abd Acute W/chest  04/28/2014   CLINICAL DATA:  Abdominal pain and distention. Firmness the abdomen.  EXAM: ACUTE ABDOMEN SERIES (ABDOMEN 2 VIEW & CHEST 1 VIEW)  COMPARISON:  10/18/2010 ; 11/23/2007  FINDINGS: Mild atherosclerotic calcification of the aortic arch. The lungs appear clear. Heart size within normal limits.  Scattered air-fluid levels are present in dilated small bowel loops up to 5.5 cm in diameter, with fluid levels at differing vertical heights in loops favoring small bowel obstruction.  Gas and stool are present in the colon. Lumbar spondylosis and degenerative disc disease.  IMPRESSION: 1. Radiographic findings characteristic of small bowel obstruction, with dilated loops  with air-fluid levels, and the air- fluid levels and differing vertical height within individual loops.   Electronically Signed   By: Van Clines M.D.   On: 04/28/2014 17:26    Review of Systems  Constitutional: Negative.   HENT: Negative.   Eyes: Negative.   Respiratory: Negative.   Cardiovascular: Negative.   Gastrointestinal: Positive for nausea, vomiting and abdominal pain.  Genitourinary: Negative.   Musculoskeletal: Negative.   Skin: Negative.   Neurological: Negative.   Endo/Heme/Allergies: Negative.   Psychiatric/Behavioral: Negative.     Blood pressure 137/70, pulse 96, temperature 97.8 F (36.6 C), temperature source Oral, resp. rate 18, SpO2 95 %. Physical Exam  Constitutional: He is oriented to person, place, and time. He appears well-developed and well-nourished.  HENT:  Head: Normocephalic and atraumatic.  Eyes: Conjunctivae and EOM are normal. Pupils are equal, round, and reactive to light.  Neck: Normal range of motion. Neck supple.  Cardiovascular: Normal rate, regular rhythm and normal heart sounds.   Respiratory: Effort normal and breath sounds normal.  GI: Soft. Bowel sounds are normal. He exhibits no distension. There is no tenderness.  Musculoskeletal: Normal range of motion.  Neurological: He is alert and oriented to person, place, and time.  Skin: Skin is warm and dry.  Psychiatric: He has a normal mood and affect. His behavior is normal.     Assessment/Plan The pt appears to have a partial sbo vs possible enteritis with ileus. I will plan to admit him for IV hydration and bowel rest and will recheck abd xrays tomorrow  TOTH III,PAUL S 04/30/2014, 3:11 PM

## 2014-04-30 NOTE — ED Notes (Signed)
Pt given orange juice.

## 2014-04-30 NOTE — ED Notes (Signed)
Patient transported to X-ray 

## 2014-04-30 NOTE — ED Provider Notes (Signed)
CSN: [id number]     Arrival date & time 04/30/14  4098 History   First MD Initiated Contact with Patient 04/30/14 1004     Chief Complaint  Patient presents with  . Emesis     (Consider location/radiation/quality/duration/timing/severity/associated sxs/prior Treatment) HPI Comments: 74 year old male with history of possible small bowel obstruction that was nonsurgical presents with return visit since Sunday with vomiting and persistent central abdominal pain nonradiating. His pain is improved however he is now vomiting since this morning nonbloody. No fevers or chills. Similar to previous. Surgery reviewed images last visit however since patient tolerating oral normal bowel movements close outpatient follow discussed. Symptoms worsening.  Patient is a 74 y.o. male presenting with vomiting. The history is provided by the patient.  Emesis Associated symptoms: abdominal pain   Associated symptoms: no chills and no headaches     Past Medical History  Diagnosis Date  . Asthma   . Allergic rhinitis   . Diabetes mellitus without complication   . Hyperlipidemia    Past Surgical History  Procedure Laterality Date  . Abdominal exploration surgery  2010    found blockage on cxr---surgery showed nothing  . Cyst excision  1966   Family History  Problem Relation Age of Onset  . Heart attack Mother   . Heart attack Father   . Heart disease Brother   . Prostate cancer Brother    History  Substance Use Topics  . Smoking status: Never Smoker   . Smokeless tobacco: Never Used  . Alcohol Use: Yes     Comment: occassional//rare-about 2 glasses of white wine a month    Review of Systems  Constitutional: Positive for appetite change. Negative for fever and chills.  HENT: Negative for congestion.   Eyes: Negative for visual disturbance.  Respiratory: Negative for shortness of breath.   Cardiovascular: Negative for chest pain.  Gastrointestinal: Positive for nausea, vomiting and abdominal  pain.  Genitourinary: Negative for dysuria and flank pain.  Musculoskeletal: Negative for back pain, neck pain and neck stiffness.  Skin: Negative for rash.  Neurological: Negative for light-headedness and headaches.      Allergies  Review of patient's allergies indicates no known allergies.  Home Medications   Prior to Admission medications   Medication Sig Start Date End Date Taking? Authorizing Provider  acetaminophen (TYLENOL) 500 MG tablet Take 1,000 mg by mouth every 6 (six) hours as needed for mild pain or headache.   Yes Historical Provider, MD  albuterol (PROVENTIL HFA;VENTOLIN HFA) 108 (90 BASE) MCG/ACT inhaler Inhale 2 puffs into the lungs every 6 (six) hours as needed for wheezing. 06/05/12  Yes Barbaraann Share, MD  aspirin EC 81 MG tablet Take 81 mg by mouth daily.   Yes Historical Provider, MD  Fluticasone-Salmeterol (ADVAIR) 250-50 MCG/DOSE AEPB Inhale 1 puff into the lungs daily. 07/02/13  Yes Barbaraann Share, MD  rosuvastatin (CRESTOR) 20 MG tablet Take 20 mg by mouth daily.   Yes Historical Provider, MD   BP 146/76 mmHg  Pulse 90  Temp(Src) 97.9 F (36.6 C) (Oral)  Resp 16  Ht 6' (1.829 m)  Wt 250 lb 11.2 oz (113.717 kg)  BMI 33.99 kg/m2  SpO2 97% Physical Exam  Constitutional: He is oriented to person, place, and time. He appears well-developed and well-nourished.  HENT:  Head: Normocephalic and atraumatic.  Mild dry mucous membranes  Eyes: Conjunctivae are normal. Right eye exhibits no discharge. Left eye exhibits no discharge.  Neck: Normal range of motion. Neck  supple. No tracheal deviation present.  Cardiovascular: Regular rhythm.  Tachycardia present.   Pulmonary/Chest: Effort normal and breath sounds normal.  Abdominal: Soft. He exhibits no distension. There is tenderness ( mild central). There is no guarding.  Musculoskeletal: He exhibits no edema.  Neurological: He is alert and oriented to person, place, and time.  Skin: Skin is warm. No rash noted.   Psychiatric: He has a normal mood and affect.  Nursing note and vitals reviewed.   ED Course  Procedures (including critical care time) Labs Review Labs Reviewed  BASIC METABOLIC PANEL - Abnormal; Notable for the following:    Glucose, Bld 117 (*)    GFR calc non Af Amer 71 (*)    GFR calc Af Amer 83 (*)    All other components within normal limits  BASIC METABOLIC PANEL - Abnormal; Notable for the following:    Glucose, Bld 102 (*)    GFR calc non Af Amer 86 (*)    All other components within normal limits  CBC WITH DIFFERENTIAL/PLATELET  CBC  I-STAT CG4 LACTIC ACID, ED    Imaging Review Dg Abd 2 Views  05/02/2014   CLINICAL DATA:  Four day history of epigastric region pain and emesis  EXAM: ABDOMEN - 2 VIEW  COMPARISON:  May 01, 2014 abdominal radiographs and CT abdomen and pelvis April 28, 2014  FINDINGS: Supine and upright images were obtained. There is generalized small bowel dilatation with scattered air-fluid levels. There is contrast in the colon. No free air. There is degenerative change in the thoracic spine. There are no abnormal calcifications. Lung bases are clear.  IMPRESSION: The bowel gas pattern is suggestive ileus or possibly a degree of obstruction. No free air. Note that there is contrast in a nondistended colon. The appearance is similar to 1 day prior.   Electronically Signed   By: Bretta BangWilliam  Woodruff III M.D.   On: 05/02/2014 09:59     EKG Interpretation None      MDM   Final diagnoses:  SBO (small bowel obstruction)   Patient presents with persistent pain and now vomiting, recent CT scan results reviewed concerning for possible small bowel obstruction. Discussed with general surgery, x-rays pending blood work pending. IV fluids and nausea medicines ordered. On recheck, no vomiting, pain controlled, pt improved.   General surgery evaluated the patient in ER and gave recs.   Medications  sodium chloride 0.9 % bolus 1,000 mL (0 mLs Intravenous Stopped  04/30/14 1427)    Filed Vitals:   05/01/14 1936 05/01/14 2144 05/02/14 0530 05/02/14 1320  BP:  122/79 114/68 146/76  Pulse:  88 88 90  Temp:  98.2 F (36.8 C) 98 F (36.7 C) 97.9 F (36.6 C)  TempSrc:  Oral Oral Oral  Resp:  18 16 16   Height:      Weight:      SpO2: 96% 95% 98% 97%    Final diagnoses:  SBO (small bowel obstruction)       Blane OharaJoshua Audery Wassenaar, MD 05/04/14 [id number]

## 2014-04-30 NOTE — Progress Notes (Signed)
Patient stated to RT that he does his Advair once a day at home. He only wants his Dulera once in the evening here.

## 2014-04-30 NOTE — ED Notes (Signed)
Pt here on Sunday.  Pt was here for abdominal pain. Dx with gastritis but did have slight obstruction ???.  Pt starting having emesis last night.  No fever.  Slight abdominal pain.

## 2014-05-01 ENCOUNTER — Observation Stay (HOSPITAL_COMMUNITY): Payer: Medicare Other

## 2014-05-01 DIAGNOSIS — E119 Type 2 diabetes mellitus without complications: Secondary | ICD-10-CM | POA: Diagnosis present

## 2014-05-01 DIAGNOSIS — R109 Unspecified abdominal pain: Secondary | ICD-10-CM | POA: Diagnosis present

## 2014-05-01 DIAGNOSIS — K5660 Unspecified intestinal obstruction: Secondary | ICD-10-CM | POA: Diagnosis present

## 2014-05-01 DIAGNOSIS — Z7982 Long term (current) use of aspirin: Secondary | ICD-10-CM | POA: Diagnosis not present

## 2014-05-01 DIAGNOSIS — E785 Hyperlipidemia, unspecified: Secondary | ICD-10-CM | POA: Diagnosis present

## 2014-05-01 DIAGNOSIS — J45909 Unspecified asthma, uncomplicated: Secondary | ICD-10-CM | POA: Diagnosis present

## 2014-05-01 DIAGNOSIS — Z79899 Other long term (current) drug therapy: Secondary | ICD-10-CM | POA: Diagnosis not present

## 2014-05-01 LAB — CBC
HEMATOCRIT: 43.9 % (ref 39.0–52.0)
HEMOGLOBIN: 14.5 g/dL (ref 13.0–17.0)
MCH: 30.9 pg (ref 26.0–34.0)
MCHC: 33 g/dL (ref 30.0–36.0)
MCV: 93.6 fL (ref 78.0–100.0)
Platelets: 238 10*3/uL (ref 150–400)
RBC: 4.69 MIL/uL (ref 4.22–5.81)
RDW: 13.8 % (ref 11.5–15.5)
WBC: 5.1 10*3/uL (ref 4.0–10.5)

## 2014-05-01 NOTE — Progress Notes (Signed)
UR completed 

## 2014-05-01 NOTE — Progress Notes (Signed)
Subjective: Long discussion, he feels OK, no real complaints, I showed him his film and got a long history on similar symptoms that goes back 30 years.  This is the 3rd time this has occurred.    Objective: Vital signs in last 24 hours: Temp:  [97.6 F (36.4 C)-98.2 F (36.8 C)] 98.2 F (36.8 C) (04/06 0452) Pulse Rate:  [91-112] 91 (04/06 0452) Resp:  [16-18] 16 (04/06 0452) BP: (93-141)/(52-84) 93/52 mmHg (04/06 0452) SpO2:  [95 %-99 %] 99 % (04/06 0452) Weight:  [113.717 kg (250 lb 11.2 oz)] 113.717 kg (250 lb 11.2 oz) (04/05 1615) Last BM Date: 04/30/14 NPO BP down 3AM 253 + on I/O No labs Film this AM shows persistent SB dilatation, contrast in colon. Intake/Output from previous day: 04/05 0701 - 04/06 0700 In: 1303.3 [I.V.:1303.3] Out: 1050 [Urine:1050] Intake/Output this shift:    General appearance: alert, cooperative and no distress GI: soft, still distended, but less than yesterday.  No nausea, nontender.    Lab Results:   Recent Labs  04/28/14 1654 04/30/14 1030  WBC 8.2 7.2  HGB 16.3 16.5  HCT 49.5 50.1  PLT 236 271    BMET  Recent Labs  04/28/14 1654 04/30/14 1030  NA 140 138  K 4.1 4.5  CL 103 99  CO2 26 25  GLUCOSE 130* 117*  BUN 17 18  CREATININE 0.94 1.02  CALCIUM 9.1 9.2   PT/INR No results for input(s): LABPROT, INR in the last 72 hours.   Recent Labs Lab 04/28/14 1654  AST 20  ALT 22  ALKPHOS 64  BILITOT 1.6*  PROT 7.1  ALBUMIN 4.0     Lipase     Component Value Date/Time   LIPASE 13 04/28/2014 1654     Studies/Results: Dg Abd Acute W/chest  04/30/2014   CLINICAL DATA:  Nausea and vomiting since Sunday, had abdominal pain, with seen in Emergency Department on Sunday, history asthma, diabetes  EXAM: ACUTE ABDOMEN SERIES (ABDOMEN 2 VIEW & CHEST 1 VIEW)  COMPARISON:  04/28/2014; correlation CT abdomen and pelvis 04/28/2014  FINDINGS: Normal heart size, mediastinal contours, and pulmonary vascularity.  Lungs clear.  No  pleural effusion or pneumothorax.  Retained contrast in nondistended colon to rectum.  Dilated small bowel loops in mid abdomen and LEFT upper quadrant out of proportion to colonic diameter compatible with small bowel obstruction.  Largest small bowel loop measures 6.2 cm transverse.  No definite bowel wall thickening or free intraperitoneal air.  Degenerative disc disease changes lumbar spine.  No definite urinary tract calcification.  IMPRESSION: Dilated small bowel loops consistent with small bowel obstruction.  Contrast from prior CT exam of 04/28/2014 is present within a decompressed colon indicating small bowel obstruction is partial.   Electronically Signed   By: Ulyses SouthwardMark  Boles M.D.   On: 04/30/2014 11:22   Dg Abd Portable 2v  05/01/2014   CLINICAL DATA:  Follow-up of small bowel obstruction  EXAM: PORTABLE ABDOMEN - 2 VIEW  COMPARISON:  Acute abdominal series of April 30, 2014  FINDINGS: There remain loops of moderately distended gas-filled small bowel in the mid and upper abdomen. The nondistended colon exhibits some further distal migration of contrast to the level of the rectum. No free extraluminal gas collections are demonstrated.  IMPRESSION: Persistent partial distal small bowel obstruction.   Electronically Signed   By: David  SwazilandJordan   On: 05/01/2014 07:36    Medications: . heparin  5,000 Units Subcutaneous 3 times per day  .  mometasone-formoterol  2 puff Inhalation BID  . ondansetron (ZOFRAN) IV  4 mg Intravenous Once  . pantoprazole (PROTONIX) IV  40 mg Intravenous QHS   . dextrose 5 % and 0.9 % NaCl with KCl 20 mEq/L 100 mL/hr at 05/01/14 0600   Prior to Admission medications   Medication Sig Start Date End Date Taking? Authorizing Provider  acetaminophen (TYLENOL) 500 MG tablet Take 1,000 mg by mouth every 6 (six) hours as needed for mild pain or headache.   Yes Historical Provider, MD  albuterol (PROVENTIL HFA;VENTOLIN HFA) 108 (90 BASE) MCG/ACT inhaler Inhale 2 puffs into the lungs  every 6 (six) hours as needed for wheezing. 06/05/12  Yes Barbaraann Share, MD  aspirin EC 81 MG tablet Take 81 mg by mouth daily.   Yes Historical Provider, MD  Fluticasone-Salmeterol (ADVAIR) 250-50 MCG/DOSE AEPB Inhale 1 puff into the lungs daily. 07/02/13  Yes Barbaraann Share, MD  rosuvastatin (CRESTOR) 20 MG tablet Take 20 mg by mouth daily.   Yes Historical Provider, MD     Assessment/Plan SBO vs Enteritis Hx of abd laparotomy for SBO 2010 AODM Asthma Dyslipidemia DVT:  Heparin/SCD    Plan:  I am going to give him sips of clears from the floor, continue to hydrate, and watch him today, repeat film tomorrow.  Recheck labs in AM.      Sherrie George 05/01/2014

## 2014-05-02 ENCOUNTER — Inpatient Hospital Stay (HOSPITAL_COMMUNITY): Payer: Medicare Other

## 2014-05-02 LAB — BASIC METABOLIC PANEL
Anion gap: 9 (ref 5–15)
BUN: 9 mg/dL (ref 6–23)
CHLORIDE: 108 mmol/L (ref 96–112)
CO2: 23 mmol/L (ref 19–32)
CREATININE: 0.82 mg/dL (ref 0.50–1.35)
Calcium: 8.4 mg/dL (ref 8.4–10.5)
GFR calc Af Amer: >90 mL/min (ref >90)
GFR calc non Af Amer: 86 mL/min — ABNORMAL LOW (ref >90)
Glucose, Bld: 102 mg/dL — ABNORMAL HIGH (ref 70–99)
Potassium: 3.8 mmol/L (ref 3.5–5.1)
Sodium: 140 mmol/L (ref 135–145)

## 2014-05-02 NOTE — Discharge Instructions (Signed)
Small Bowel Obstruction A small bowel obstruction is a blockage (obstruction) of the small intestine (small bowel). The small bowel is a long, slender tube that connects the stomach to the colon. Its job is to absorb nutrients from the fluids and foods you consume into the bloodstream.  CAUSES  There are many causes of intestinal blockage. The most common ones include:  Hernias. This is a more common cause in children than adults.  Inflammatory bowel disease (enteritis and colitis).  Twisting of the bowel (volvulus).  Tumors.  Scar tissue (adhesions) from previous surgery or radiation treatment.  Recent surgery. This may cause an acute small bowel obstruction called an ileus. SYMPTOMS   Abdominal pain. This may be dull cramps or sharp pain. It may occur in one area or may be present in the entire abdomen. Pain can range from mild to severe, depending on the degree of obstruction.  Nausea and vomiting. Vomit may be greenish or yellow bile color.  Distended or swollen stomach. Abdominal bloating is a common symptom.  Constipation.  Lack of passing gas.  Frequent belching.  Diarrhea. This may occur if runny stool is able to leak around the obstruction. DIAGNOSIS  Your caregiver can usually diagnose small bowel obstruction by taking a history, doing a physical exam, and taking X-rays. If the cause is unclear, a CT scan (computerized tomography) of your abdomen and pelvis may be needed. TREATMENT  Treatment of the blockage depends on the cause and how bad the problem is.   Sometimes, the obstruction improves with bed rest and intravenous (IV) fluids.  Resting the bowel is very important. This means following a simple diet. Sometimes, a clear liquid diet may be required for several days.  Sometimes, a small tube (nasogastric tube) is placed into the stomach to decompress the bowel. When the bowel is blocked, it usually swells up like a balloon filled with air and fluids.  Decompression means that the air and fluids are removed by suction through that tube. This can help with pain, discomfort, and nausea. It can also help the obstruction resolve faster.  Surgery may be required if other treatments do not work. Bowel obstruction from a hernia may require early surgery and can be an emergency procedure. Adhesions that cause frequent or severe obstructions may also require surgery. HOME CARE INSTRUCTIONS If your bowel obstruction is only partial or incomplete, you may be allowed to go home.  Get plenty of rest.  Follow your diet as directed by your caregiver.  Only consume clear liquids until your condition improves.  Avoid solid foods as instructed. SEEK IMMEDIATE MEDICAL CARE IF:  You have increased pain or cramping.  You vomit blood.  You have uncontrolled vomiting or nausea.  You cannot drink fluids due to vomiting or pain.  You develop confusion.  You begin feeling very dry or thirsty (dehydrated).  You have severe bloating.  You have chills.  You have a fever.  You feel extremely weak or you faint. MAKE SURE YOU:  Understand these instructions.  Will watch your condition.  Will get help right away if you are not doing well or get worse. Document Released: 03/30/2005 Document Revised: 04/05/2011 Document Reviewed: 03/27/2010 [facility] Patient Information 2015 [place], [place]. This information is not intended to replace advice given to you by your health care provider. Make sure you discuss any questions you have with your health care provider.  Full Liquid Diet for the next 72 hours A full liquid diet may be used:  To help you transition from a clear liquid diet to a soft diet.   When your body is healing and can only tolerate foods that are easy to digest.  Before or after certain a procedure, test, or surgery (such as stomach or intestinal surgeries).   If you have trouble swallowing or chewing.  A full liquid diet includes  fluids and foods that are liquid or will become liquid at room temperature. The full liquid diet gives you the proteins, fluids, salts, and minerals that you need for energy. If you continue this diet for more than 72 hours, talk to your health care provider about how many calories you need to consume. If you continue the diet for more than 5 days, talk to your health care provider about taking a multivitamin or a nutritional supplement. WHAT DO I NEED TO KNOW ABOUT A FULL LIQUID DIET?  You may have any liquid.  You may have any food that becomes a liquid at room temperature. The food is considered a liquid if it can be poured off a spoon at room temperature.  Drink one serving of citrus or vitamin C-enriched fruit juice daily. WHAT FOODS CAN I EAT? Grains Any grain food that can be pureed in soup (such as crackers, pasta, and rice). Hot cereal (such as farina or oatmeal) that has been blended. Talk to your health care provider or dietitian about these foods. Vegetables Pulp-free tomato or vegetable juice. Vegetables pureed in soup.  Fruits Fruit juice, including nectars and juices with pulp. Meats and Other Protein Sources Eggs in custard, eggnog mix, and eggs used in ice cream or pudding. Strained meats, like in baby food, may be allowed. Consult your health care provider.  Dairy Milk and milk-based beverages, including milk shakes and instant breakfast mixes. Smooth yogurt. Pureed cottage cheese. Avoid these foods if they are not well tolerated. Beverages All beverages, including liquid nutritional supplements. Ask your health care provider if you can have carbonated beverages. They may not be well tolerated. Condiments Iodized salt, pepper, spices, and flavorings. Cocoa powder. Vinegar, ketchup, yellow mustard, smooth sauces (such as hollandaise, cheese sauce, or white sauce), and soy sauce. Sweets and Desserts Custard, smooth pudding. Flavored gelatin. Tapioca, junket. Plain ice cream,  sherbet, fruit ices. Frozen ice pops, frozen fudge pops, pudding pops, and other frozen bars with cream. Syrups, including chocolate syrup. Sugar, honey, jelly.  Fats and Oils Margarine, butter, cream, sour cream, and oils. Other Broth and cream soups. Strained, broth-based soups. The items listed above may not be a complete list of recommended foods or beverages. Contact your dietitian for more options.  WHAT FOODS CAN I NOT EAT? Grains All breads. Grains are not allowed unless they are pureed into soup. Vegetables Vegetables are not allowed unless they are juiced, or cooked and pureed into soup. Fruits Fruits are not allowed unless they are juiced. Meats and Other Protein Sources Any meat or fish. Cooked or raw eggs. Nut butters.  Dairy Cheese.  Condiments Stone ground mustards. Fats and Oils Fats that are coarse or chunky. Sweets and Desserts Ice cream or other frozen desserts that have any solids in them or on top, such as nuts, chocolate chips, and pieces of cookies. Cakes. Cookies. Candy. Others Soups with chunks or pieces in them. The items listed above may not be a complete list of foods and beverages to avoid. Contact your dietitian for more information. Document Released: 01/11/2005 Document Revised: 01/16/2013 Document Reviewed: 11/16/2012 [facility] Patient Information 2015 [place], [place].  This information is not intended to replace advice given to you by your health care provider. Make sure you discuss any questions you have with your health care provider.  Soft-Food Meal Plan for the next 72 hours after your 3 days of full liquids A soft-food meal plan includes foods that are safe and easy to swallow. This meal plan typically is used:  If you are having trouble chewing or swallowing foods.  As a transition meal plan after only having had liquid meals for a long period. WHAT DO I NEED TO KNOW ABOUT THE SOFT-FOOD MEAL PLAN? A soft-food meal plan includes tender foods  that are soft and easy to chew and swallow. In most cases, bite-sized pieces of food are easier to swallow. A bite-sized piece is about  inch or smaller. Foods in this plan do not need to be ground or pureed. Foods that are very hard, crunchy, or sticky should be avoided. Also, breads, cereals, yogurts, and desserts with nuts, seeds, or fruits should be avoided. WHAT FOODS CAN I EAT? Grains Rice and wild rice. Moist bread, dressing, pasta, and noodles. Well-moistened dry or cooked cereals, such as farina (cooked wheat cereal), oatmeal, or grits. Biscuits, breads, muffins, pancakes, and waffles that have been well moistened. Vegetables Shredded lettuce. Cooked, tender vegetables, including potatoes without skins. Vegetable juices. Broths or creamed soups made with vegetables that are not stringy or chewy. Strained tomatoes (without seeds). Fruits Canned or well-cooked fruits. Soft (ripe), peeled fresh fruits, such as peaches, nectarines, kiwi, cantaloupe, honeydew melon, and watermelon (without seeds). Soft berries with small seeds, such as strawberries. Fruit juices (without pulp). Meats and Other Protein Sources Moist, tender, lean beef. Mutton. Lamb. Veal. Chicken. [place]. Liver. Ham. Fish without bones. Eggs. Dairy Milk, milk drinks, and cream. Plain cream cheese and cottage cheese. Plain yogurt. Sweets/Desserts Flavored gelatin desserts. Custard. Plain ice cream, frozen yogurt, sherbet, milk shakes, and malts. Plain cakes and cookies. Plain hard candy.  Other Butter, margarine (without trans fat), and cooking oils. Mayonnaise. Cream sauces. Mild spices, salt, and sugar. Syrup, molasses, honey, and jelly. The items listed above may not be a complete list of recommended foods or beverages. Contact your dietitian for more options. WHAT FOODS ARE NOT RECOMMENDED? Grains Dry bread, toast, crackers that have not been moistened. Coarse or dry cereals, such as bran, granola, and shredded wheat.  Tough or chewy crusty breads, such as [place] bread or baguettes. Vegetables Corn. Raw vegetables except shredded lettuce. Cooked vegetables that are tough or stringy. Tough, crisp, fried potatoes and potato skins. Fruits Fresh fruits with skins or seeds or both, such as apples, pears, or grapes. Stringy, high-pulp fruits, such as papaya, pineapple, coconut, or mango. Fruit leather, fruit roll-ups, and all dried fruits. Meats and Other Protein Sources Sausages and hot dogs. Meats with gristle. Fish with bones. Nuts, seeds, and chunky peanut or other nut butters. Sweets/Desserts Cakes or cookies that are very dry or chewy.  The items listed above may not be a complete list of foods and beverages to avoid. Contact your dietitian for more information. Document Released: 04/20/2007 Document Revised: 01/16/2013 Document Reviewed: 12/08/2012 [facility] Patient Information 2015 [place], [place]. This information is not intended to replace advice given to you by your health care provider. Make sure you discuss any questions you have with your health care provider.

## 2014-05-02 NOTE — Discharge Summary (Signed)
Reviewed d/c instructions with pt including follow-up appointment, precautions, medications, and dietary progression/instruction.  He verbalized understanding of all topics discussed.  Pt being d/c to home into care of wife.

## 2014-05-02 NOTE — Progress Notes (Signed)
Additional data for PIV removal (closed screen prematurely by accident).  PIV removed with catheter intact, skin clean and dry w/o bleeding.

## 2014-05-02 NOTE — Discharge Summary (Signed)
Physician Discharge Summary  Patient ID: Arthur Liu MRN: [id number] DOB/AGE: [birth year] 73 y.o.  Admit date: 04/30/2014 Discharge date: 05/02/2014  Admission Diagnoses:  SBO vs Enteritis Hx of abd laparotomy for SBO 2010 [facility] Asthma Dyslipidemia Discharge Diagnoses:  Partial SBO Hx of abd laparotomy for SBO 2010 [facility] Asthma Dyslipidemia Active Problems:   SBO (small bowel obstruction)   PROCEDURES: None  Hospital Course: The pt is a 74 yo wm who presents with intermittent abdominal pain centrally since Saturday. It has been associated with an episode or 2 of nausea and vomiting. He has passed flatus today and had normal bm's this am and yesterday. No diarrhea. No fever. His granddaughter has been sick with a GI bug. He had a similar episode of this in 2010 that looked like a sbo. He underwent surgery in [place] and they found nothing. His current xray shows a dilated loop of small bowel and the contrast he drank 2 days ago is in his colon and rectum. Pt was seen in ED and admitted for observation.  He had BM on admit and has had BM each day.  He had films each day which showed ongoing SB dilatation with contrast in the colon.  He was advanced to full liquids this AM and is tolerating them well.  Despite his AXR that shows ongoing bowel gas pattern is suggestive ileus or possibly a degree of obstruction. No free air. Note that there is contrast in a nondistended colon. The appearance is similar to 1 day prior.   Dr. Derrell Lollingamirez saw him in the AM and he was just seen by Dr. Carolynne Edouardoth in the PM.  Pt would like to go home.  Dr. Carolynne Edouardoth told him his symptoms may reoccur, and that film clearly shows ongoing dilatation.  He would like to go home.  We will keep him on full liquids for 3 day and the soft diet for 3 days following that.  He is to call if he has reoccurrence of his symptoms.  Condition on d/c:  Improving/stable    Disposition: 01-Home or Self Care     Medication List    TAKE  these medications        acetaminophen 500 MG tablet  Commonly known as:  TYLENOL  Take 1,000 mg by mouth every 6 (six) hours as needed for mild pain or headache.     albuterol 108 (90 BASE) MCG/ACT inhaler  Commonly known as:  PROVENTIL HFA;VENTOLIN HFA  Inhale 2 puffs into the lungs every 6 (six) hours as needed for wheezing.     aspirin EC 81 MG tablet  Take 81 mg by mouth daily.     Fluticasone-Salmeterol 250-50 MCG/DOSE Aepb  Commonly known as:  ADVAIR  Inhale 1 puff into the lungs daily.     rosuvastatin 20 MG tablet  Commonly known as:  CRESTOR  Take 20 mg by mouth daily.           Follow-up Information    Follow up with Mickie HillierLITTLE,KEVIN LORNE, MD.   Specialty:  Family Medicine   Why:  Call and follow up with him, let him know you were in the hospital again for this.   Contact information:   [address] [place] [address] [phone]       Follow up with Robyne AskewTH III,PAUL S, MD.   Specialty:  General Surgery   Why:  As needed   Contact information:   [address] [place] [address] [phone]  SignedSherrie George 05/02/2014, 3:15 PM

## 2014-05-02 NOTE — Progress Notes (Signed)
  Subjective: Pt doing well today.  +BM x2.  No abd pain  Objective: Vital signs in last 24 hours: Temp:  [98 F (36.7 C)-98.9 F (37.2 C)] 98 F (36.7 C) (04/07 0530) Pulse Rate:  [75-98] 88 (04/07 0530) Resp:  [16-18] 16 (04/07 0530) BP: (114-136)/(66-79) 114/68 mmHg (04/07 0530) SpO2:  [93 %-100 %] 98 % (04/07 0530) Last BM Date: 05/01/14  Intake/Output from previous day: 04/06 0701 - 04/07 0700 In: 1920 [P.O.:720; I.V.:1200] Out: 1150 [Urine:1150] Intake/Output this shift:    General appearance: alert and cooperative GI: soft, non-tender; bowel sounds normal; no masses,  no organomegaly  Lab Results:   Recent Labs  04/30/14 1030 05/01/14 0920  WBC 7.2 5.1  HGB 16.5 14.5  HCT 50.1 43.9  PLT 271 238   BMET  Recent Labs  04/30/14 1030 05/02/14 0525  NA 138 140  K 4.5 3.8  CL 99 108  CO2 25 23  GLUCOSE 117* 102*  BUN 18 9  CREATININE 1.02 0.82  CALCIUM 9.2 8.4   PT/INR No results for input(s): LABPROT, INR in the last 72 hours. ABG No results for input(s): PHART, HCO3 in the last 72 hours.  Invalid input(s): PCO2, PO2  Studies/Results: Dg Abd Acute W/chest  04/30/2014   CLINICAL DATA:  Nausea and vomiting since Sunday, had abdominal pain, with seen in Emergency Department on Sunday, history asthma, diabetes  EXAM: ACUTE ABDOMEN SERIES (ABDOMEN 2 VIEW & CHEST 1 VIEW)  COMPARISON:  04/28/2014; correlation CT abdomen and pelvis 04/28/2014  FINDINGS: Normal heart size, mediastinal contours, and pulmonary vascularity.  Lungs clear.  No pleural effusion or pneumothorax.  Retained contrast in nondistended colon to rectum.  Dilated small bowel loops in mid abdomen and LEFT upper quadrant out of proportion to colonic diameter compatible with small bowel obstruction.  Largest small bowel loop measures 6.2 cm transverse.  No definite bowel wall thickening or free intraperitoneal air.  Degenerative disc disease changes lumbar spine.  No definite urinary tract  calcification.  IMPRESSION: Dilated small bowel loops consistent with small bowel obstruction.  Contrast from prior CT exam of 04/28/2014 is present within a decompressed colon indicating small bowel obstruction is partial.   Electronically Signed   By: Ulyses SouthwardMark  Boles M.D.   On: 04/30/2014 11:22   Dg Abd Portable 2v  05/01/2014   CLINICAL DATA:  Follow-up of small bowel obstruction  EXAM: PORTABLE ABDOMEN - 2 VIEW  COMPARISON:  Acute abdominal series of April 30, 2014  FINDINGS: There remain loops of moderately distended gas-filled small bowel in the mid and upper abdomen. The nondistended colon exhibits some further distal migration of contrast to the level of the rectum. No free extraluminal gas collections are demonstrated.  IMPRESSION: Persistent partial distal small bowel obstruction.   Electronically Signed   By: David  Swazil[place]Jordan   On: 05/01/2014 07:36    Anti-infectives: Anti-infectives    None      Assessment/Plan: Resolving pSBO 1. Start diet 2. If tolerated PO likely home later today or in AM  LOS: 1 day    Marigene EhlersRamirez Jr., Jed LimerickArmando 05/02/2014

## 2014-05-09 ENCOUNTER — Encounter: Payer: Medicare Other | Attending: Family Medicine

## 2014-05-09 DIAGNOSIS — Z713 Dietary counseling and surveillance: Secondary | ICD-10-CM | POA: Diagnosis not present

## 2014-05-09 DIAGNOSIS — E119 Type 2 diabetes mellitus without complications: Secondary | ICD-10-CM | POA: Diagnosis not present

## 2014-05-14 NOTE — Progress Notes (Signed)
Patient was seen on 05/09/14 for the third of a series of three diabetes self-management courses at the [facility].   Janene Madeira. State the amount of activity recommended for healthy living . Describe activities suitable for individual needs . Identify ways to regularly incorporate activity into daily life . Identify barriers to activity and ways to over come these barriers  Identify diabetes medications being personally used and their primary action for lowering glucose and possible side effects . Describe role of stress on blood glucose and develop strategies to address psychosocial issues . Identify diabetes complications and ways to prevent them  Explain how to manage diabetes during illness . Evaluate success in meeting personal goal . Establish 2-3 goals that they will plan to diligently work on until they return for the  60109-month follow-up visit  Goals:   Eat smaller portions  I will be active 40 minutes or more 5 times a week  To help manage stress I will  exercise at least 5 times a week  Your patient has identified these potential barriers to change:  Must change my mindset  Your patient has identified their diabetes self-care support plan as  Family Support Plan:  Attend Core 4 in 4 months

## 2014-10-08 ENCOUNTER — Ambulatory Visit (INDEPENDENT_AMBULATORY_CARE_PROVIDER_SITE_OTHER): Payer: Medicare Other | Admitting: Internal Medicine

## 2014-10-08 ENCOUNTER — Encounter: Payer: Self-pay | Admitting: Internal Medicine

## 2014-10-08 VITALS — BP 120/70 | HR 80 | Ht 72.0 in | Wt 217.0 lb

## 2014-10-08 DIAGNOSIS — J45909 Unspecified asthma, uncomplicated: Secondary | ICD-10-CM | POA: Diagnosis not present

## 2014-10-08 DIAGNOSIS — J339 Nasal polyp, unspecified: Secondary | ICD-10-CM | POA: Diagnosis not present

## 2014-10-08 MED ORDER — MOMETASONE FURO-FORMOTEROL FUM 100-5 MCG/ACT IN AERO: INHALATION_SPRAY | RESPIRATORY_TRACT | Status: DC

## 2014-10-08 NOTE — Progress Notes (Signed)
Patient ID: Arth Liu, male   DOB: Jun 25, 1940  MRN: [id number]    Brief patient profile:  76 yowm never smoker with 2 attacks of advair around 2006 with dx of nasal polyposis also then and followed by Clance on advair 250 one puff daily and no problems since  With spirometry wnl 10/08/2014    History of Present Illness  10/08/2014 1st [facility]  office visit/ Wert re: asthma/ polyposis  Chief Complaint  Patient presents with  . Follow-up    Pt states breathing is doing well and he has no complaints at this time.   Not limited by breathing from desired activities  / never uses hfa   No obvious day to day or daytime variability or assoc chronic cough or cp or chest tightness, subjective wheeze or overt sinus or hb symptoms. No unusual exp hx or h/o childhood pna/ asthma or knowledge of premature birth.  Sleeping ok without nocturnal  or early am exacerbation  of respiratory  c/o's or need for noct saba. Also denies any obvious fluctuation of symptoms with weather or environmental changes or other aggravating or alleviating factors except as outlined above   Current Medications, Allergies, Complete Past Medical History, Past Surgical History, Family History, and Social History were reviewed in [employer] record.  ROS  The following are not active complaints unless bolded sore throat, dysphagia, dental problems, itching, sneezing,  Min nasal congestion or excess/ purulent secretions, ear ache,   fever, chills, sweats, unintended wt loss, classically pleuritic or exertional cp, hemoptysis,  orthopnea pnd or leg swelling, presyncope, palpitations, abdominal pain, anorexia, nausea, vomiting, diarrhea  or change in bowel or bladder habits, change in stools or urine, dysuria,hematuria,  rash, arthralgias, visual complaints, headache, numbness, weakness or ataxia or problems with walking or coordination,  change in mood/affect or memory.      Objective:   Physical Exam   amb nad    Wt Readings from Last 3 Encounters:  10/08/14 217 lb (98.431 kg)  04/30/14 250 lb 11.2 oz (113.717 kg)  03/27/14 259 lb 4.8 oz (117.618 kg)    Vital signs reviewed        HEENT: nl dentition,  and orophanx. Nl external ear canals without cough reflex Mild non-specific  turbiate edema s obvious polyps   NECK :  without JVD/Nodes/TM/ nl carotid upstrokes bilaterally   LUNGS: no acc muscle use, clear to A and P bilaterally without cough on insp or exp maneuvers   CV:  RRR  no s3 or murmur or increase in P2, no edema   ABD:  soft and nontender with nl excursion in the supine position. No bruits or organomegaly, bowel sounds nl  MS:  warm without deformities, calf tenderness, cyanosis or clubbing  SKIN: warm and dry without lesions    NEURO:  alert, approp, no deficits          Assessment:

## 2014-10-08 NOTE — Patient Instructions (Signed)
Stop advair   Start dulera 100 Take 2 puffs first thing in am and then if not doing great another 2 puffs about 12 hours later.   After a week if you want to try off the dulera that's fine but be sure you have access to your rescue inhaler and if you start needing it resume dulera as above  Work on inhaler technique:  relax and gently blow all the way out then take a nice smooth deep breath back in, triggering the inhaler at same time you start breathing in.  Hold for up to 5 seconds if you can. Blow out thru nose. Rinse and gargle with water when done      If you are satisfied with your treatment plan,  let your doctor know and he/she can either refill your medications or you can return here when your prescription runs out.     If in any way you are not 100% satisfied,  please tell us.  If 100% better, tell your friends!  Pulmonary follow up is as needed

## 2014-10-09 ENCOUNTER — Encounter: Payer: Self-pay | Admitting: Internal Medicine

## 2014-10-09 NOTE — Assessment & Plan Note (Addendum)
PFT's 01/2004:  Ratio 49% Arlyce Harman 07/2004:  FEV1 2.86 (71%), ratio 61  (after treatment).  - spirometry 10/08/2014  FEV1 - 10/08/2014 p extensive coaching HFA effectiveness =    75%  But baseline 25%  - spirometry 10/08/2014 > wnl x min mid flow changes on advair > try change to dulera 100 2 q 12 h "prn"   All goals of chronic asthma control met including optimal function and elimination of symptoms with minimal need for rescue therapy on very low dose advair and probably would do just as well with dulera 100 dosed 2 pffs  q am with ability to boost to 2 bid for flare and stop altogether if doing great and not breaking the rule of 2s   Contingencies discussed in full including contacting this office immediately if not controlling the symptoms using the rule of two's.     10/08/2014  extensive coaching HFA effectiveness =    75%   I had an extended discussion with the patient reviewing all relevant studies completed to date and  lasting 15 to 20 minutes of a 25 minute visit    Each maintenance medication was reviewed in detail including most importantly the difference between maintenance and prns and under what circumstances the prns are to be triggered using an action plan format that is not reflected in the computer generated alphabetically organized AVS.    Please see instructions for details which were reviewed in writing and the patient given a copy highlighting the part that I personally wrote and discussed at today's ov.

## 2014-10-09 NOTE — Assessment & Plan Note (Signed)
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  Sub total opacification of the ethmoid air cells and left maxillary  sinus. Right frontal and sphenoid sinuses relatively spared.   Not active but certainly a concern, won't stay on nasal steroids so rec he blow the ICS out thru his nose to at least get some coverage

## 2017-02-20 ENCOUNTER — Encounter (HOSPITAL_COMMUNITY): Payer: Self-pay

## 2017-02-20 ENCOUNTER — Emergency Department (HOSPITAL_COMMUNITY): Payer: Medicare Other

## 2017-02-20 ENCOUNTER — Other Ambulatory Visit: Payer: Self-pay

## 2017-02-20 ENCOUNTER — Emergency Department (HOSPITAL_COMMUNITY)
Admission: EM | Admit: 2017-02-20 | Discharge: 2017-02-20 | Disposition: A | Discharge status: Home / Self Care | Payer: Medicare Other | Attending: Emergency Medicine | Admitting: Emergency Medicine

## 2017-02-20 DIAGNOSIS — Z79899 Other long term (current) drug therapy: Secondary | ICD-10-CM | POA: Diagnosis not present

## 2017-02-20 DIAGNOSIS — J45909 Unspecified asthma, uncomplicated: Secondary | ICD-10-CM | POA: Diagnosis not present

## 2017-02-20 DIAGNOSIS — E860 Dehydration: Secondary | ICD-10-CM | POA: Diagnosis not present

## 2017-02-20 DIAGNOSIS — Z7982 Long term (current) use of aspirin: Secondary | ICD-10-CM | POA: Insufficient documentation

## 2017-02-20 DIAGNOSIS — E119 Type 2 diabetes mellitus without complications: Secondary | ICD-10-CM | POA: Insufficient documentation

## 2017-02-20 DIAGNOSIS — R1110 Vomiting, unspecified: Secondary | ICD-10-CM | POA: Diagnosis present

## 2017-02-20 LAB — URINALYSIS, ROUTINE W REFLEX MICROSCOPIC
Bilirubin Urine: NEGATIVE
GLUCOSE, UA: NEGATIVE mg/dL
Hgb urine dipstick: NEGATIVE
Ketones, ur: 20 mg/dL — AB
LEUKOCYTES UA: NEGATIVE
NITRITE: NEGATIVE
PH: 5 (ref 5.0–8.0)
PROTEIN: NEGATIVE mg/dL
Specific Gravity, Urine: 1.021 (ref 1.005–1.030)

## 2017-02-20 LAB — COMPREHENSIVE METABOLIC PANEL
ALT: 25 U/L (ref 17–63)
ANION GAP: 8 (ref 5–15)
AST: 26 U/L (ref 15–41)
Albumin: 4 g/dL (ref 3.5–5.0)
Alkaline Phosphatase: 61 U/L (ref 38–126)
BUN: 25 mg/dL — ABNORMAL HIGH (ref 6–20)
CHLORIDE: 105 mmol/L (ref 101–111)
CO2: 25 mmol/L (ref 22–32)
Calcium: 9.3 mg/dL (ref 8.9–10.3)
Creatinine, Ser: 0.86 mg/dL (ref 0.61–1.24)
GFR calc Af Amer: >60 mL/min (ref >60)
Glucose, Bld: 166 mg/dL — ABNORMAL HIGH (ref 65–99)
Potassium: 4.4 mmol/L (ref 3.5–5.1)
Sodium: 138 mmol/L (ref 135–145)
TOTAL PROTEIN: 7 g/dL (ref 6.5–8.1)
Total Bilirubin: 2.1 mg/dL — ABNORMAL HIGH (ref 0.3–1.2)

## 2017-02-20 LAB — CBC
HEMATOCRIT: 44 % (ref 39.0–52.0)
HEMOGLOBIN: 15.1 g/dL (ref 13.0–17.0)
MCH: 31.8 pg (ref 26.0–34.0)
MCHC: 34.3 g/dL (ref 30.0–36.0)
MCV: 92.6 fL (ref 78.0–100.0)
Platelets: 216 10*3/uL (ref 150–400)
RBC: 4.75 MIL/uL (ref 4.22–5.81)
RDW: 13.3 % (ref 11.5–15.5)
WBC: 8.6 10*3/uL (ref 4.0–10.5)

## 2017-02-20 LAB — I-STAT CG4 LACTIC ACID, ED: Lactic Acid, Venous: 1.35 mmol/L (ref 0.5–1.9)

## 2017-02-20 LAB — LIPASE, BLOOD: LIPASE: 19 U/L (ref 11–51)

## 2017-02-20 MED ORDER — SODIUM CHLORIDE 0.9 % IV BOLUS (SEPSIS)
1000.0000 mL | Freq: Once | INTRAVENOUS | Status: AC
  Administered 2017-02-20: 1000 mL via INTRAVENOUS

## 2017-02-20 MED ORDER — ONDANSETRON HCL 4 MG/2ML IJ SOLN
4.0000 mg | Freq: Once | INTRAMUSCULAR | Status: AC
  Administered 2017-02-20: 4 mg via INTRAVENOUS
  Filled 2017-02-20: qty 2

## 2017-02-20 MED ORDER — ONDANSETRON 4 MG PO TBDP
4.0000 mg | ORAL_TABLET | Freq: Once | ORAL | Status: DC | PRN
  Filled 2017-02-20: qty 1

## 2017-02-20 MED ORDER — ONDANSETRON 4 MG PO TBDP: ORAL_TABLET | ORAL | 0 refills | Status: DC

## 2017-02-20 NOTE — ED Notes (Signed)
Patient transported to X-ray 

## 2017-02-20 NOTE — ED Provider Notes (Signed)
[facility]-EMERGENCY DEPT Provider Note   CSN: [id number] Arrival date & time: 02/20/17  1024     History   Chief Complaint Chief Complaint  Patient presents with  . Emesis    HPI   Blood pressure (!) 180/92, pulse 95, temperature (!) 97.4 F (36.3 C), temperature source Oral, resp. rate 18, SpO2 95 %.  Arthur Liu is a 77 y.o. male complaining of couple episodes of nonbloody, nonbilious, non-coffee-ground emesis onset this morning at approximately 3 AM.  He is passing flatus normally, he had a normal bowel movement within the last 12 hours.  He denies any abdominal pain, fevers, chills, chest pain, sick contacts.  His wife states that he recently got a recumbent bicycle and he has been using that frequently this week, she thinks that he may have been dehydrated and she had him water within the course of several hours this morning she is worried that she may have caused the vomiting by aggressively hydrating him.  He does have a history of ex lap for possible SBO which did not result in any pathology being found.  Past Medical History:  Diagnosis Date  . Allergic rhinitis   . Asthma   . Diabetes mellitus without complication (HCC)   . Hyperlipidemia     Patient Active Problem List   Diagnosis Date Noted  . SBO (small bowel obstruction) (HCC) 04/30/2014  . Nasal polyposis 11/19/2012  . Seasonal allergic rhinitis 11/19/2012  . 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 ABDOMINAL EXPLORATION SURGERY  2010   found blockage on cxr---surgery showed nothing  . CYST EXCISION  1966       Home Medications    Prior to Admission medications   Medication Sig Start Date End Date Taking? Authorizing Provider  acetaminophen (TYLENOL) 500 MG tablet Take 1,000 mg by mouth every 6 (six) hours as needed for mild pain or headache.    [provider]  albuterol (PROVENTIL HFA;VENTOLIN HFA) 108 (90 BASE) MCG/ACT  inhaler Inhale 2 puffs into the lungs every 6 (six) hours as needed for wheezing. 06/05/12   Clance, Maree KrabbeKeith M, MD  aspirin EC 81 MG tablet Take 81 mg by mouth daily.    [provider]  mometasone-formoterol [facility](DULERA) 100-5 MCG/ACT AERO Take 2 puffs first thing in am 10/08/14   Nyoka CowdenWert, Michael B, MD  ondansetron (ZOFRAN ODT) 4 MG disintegrating tablet 4mg  ODT q4 hours prn nausea/vomit 02/20/17   Joshuwa Vecchio, Joni ReiningNicole, PA-C  rosuvastatin (CRESTOR) 20 MG tablet Take 20 mg by mouth daily.    [provider]    Family History Family History  Problem Relation Age of Onset  . Heart attack Mother   . Heart attack Father   . Heart disease Brother   . Prostate cancer Brother     Social History Social History   Tobacco Use  . Smoking status: Never Smoker  . Smokeless tobacco: Never Used  Substance Use Topics  . Alcohol use: Yes    Comment: occassional//rare-about 2 glasses of white wine a month  . Drug use: No     Allergies   Patient has no known allergies.   Review of Systems Review of Systems  A complete review of systems was obtained and all systems are negative except as noted in the HPI and PMH.   Physical Exam Updated Vital Signs BP 134/65   Pulse 86   Temp 97.9 F (36.6 C) (Oral)   Resp  19   SpO2 97%   Physical Exam  Constitutional: He is oriented to person, place, and time. He appears well-developed and well-nourished. No distress.  HENT:  Head: Normocephalic and atraumatic.  Mouth/Throat: Oropharynx is clear and moist.  Eyes: Conjunctivae and EOM are normal. Pupils are equal, round, and reactive to light.  Neck: Normal range of motion.  Cardiovascular: Normal rate, regular rhythm and intact distal pulses.  Pulmonary/Chest: Effort normal and breath sounds normal.  Abdominal: Soft. Bowel sounds are normal. He exhibits no distension and no mass. There is no tenderness. There is no rebound and no guarding. No hernia.  Musculoskeletal: Normal range of motion.   Neurological: He is alert and oriented to person, place, and time.  Skin: He is not diaphoretic.  Psychiatric: He has a normal mood and affect.  Nursing note and vitals reviewed.    ED Treatments / Results  Labs (all labs ordered are listed, but only abnormal results are displayed) Labs Reviewed  COMPREHENSIVE METABOLIC PANEL - Abnormal; Notable for the following components:      Result Value   Glucose, Bld 166 (*)    BUN 25 (*)    Total Bilirubin 2.1 (*)    All other components within normal limits  URINALYSIS, ROUTINE W REFLEX MICROSCOPIC - Abnormal; Notable for the following components:   Ketones, ur 20 (*)    All other components within normal limits  LIPASE, BLOOD  CBC  I-STAT CG4 LACTIC ACID, ED    EKG  EKG Interpretation None       Radiology Dg Abdomen Acute W/chest  Result Date: 02/20/2017 CLINICAL DATA:  Nausea, vomiting, dizziness, diabetes mellitus, hypertension, prior bowel obstruction EXAM: DG ABDOMEN ACUTE W/ 1V CHEST COMPARISON:  05/02/2014 abdominal radiographs, chest radiograph 04/30/2014 FINDINGS: Upper normal heart size. Normal mediastinal contours and pulmonary vascularity. Lungs clear. No pleural effusion or pneumothorax. Nonobstructive bowel gas pattern. Minimally prominent stool in rectum. No bowel dilatation, bowel wall thickening or free air. Degenerative disc disease changes lumbar spine. No urinary tract calcification. IMPRESSION: No acute abnormalities. Electronically Signed   By: Ulyses Southward M.D.   On: 02/20/2017 13:31    Procedures Procedures (including critical care time)  Medications Ordered in ED Medications  ondansetron (ZOFRAN-ODT) disintegrating tablet 4 mg (not administered)  sodium chloride 0.9 % bolus 1,000 mL (1,000 mLs Intravenous New Bag/Given 02/20/17 1535)  sodium chloride 0.9 % bolus 1,000 mL (0 mLs Intravenous Stopped 02/20/17 1306)  ondansetron (ZOFRAN) injection 4 mg (4 mg Intravenous Given 02/20/17 1223)     Initial  Impression / Assessment and Plan / ED Course  I have reviewed the triage vital signs and the nursing notes.  Pertinent labs & imaging results that were available during my care of the patient were reviewed by me and considered in my medical decision making (see chart for details).    Vitals:   02/20/17 1048 02/20/17 1140 02/20/17 1306 02/20/17 1535  BP: (!) 180/92 131/77 124/63 134/65  Pulse: 95 91 73 86  Resp: 18 16 16 19   Temp: (!) 97.4 F (36.3 C) (!) 97.4 F (36.3 C)  97.9 F (36.6 C)  TempSrc: Oral Oral  Oral  SpO2: 95% 95% 100% 97%    Medications  ondansetron (ZOFRAN-ODT) disintegrating tablet 4 mg (not administered)  sodium chloride 0.9 % bolus 1,000 mL (1,000 mLs Intravenous New Bag/Given 02/20/17 1535)  sodium chloride 0.9 % bolus 1,000 mL (0 mLs Intravenous Stopped 02/20/17 1306)  ondansetron (ZOFRAN) injection 4 mg (4 mg  Intravenous Given 02/20/17 1223)    Akshat Minehart is 77 y.o. male presenting with audible episodes of emesis starting this morning, patient afebrile, nontoxic-appearing, abdominal exam benign.  Patient is passing flatus, having normal bowel movements, will check basic blood work, hydrate and reassess.  Blood work reassuring, urinalysis without signs of infection, repeat abdominal exam remains benign.  Patient is very unsteady on his feet, he states he feels quite tired.  I will give the patient another liter of fluid and we will reassess trial of ambulation, I do not think this patient warrants admission to the hospital for dehydration at this point with such reassuring vitals, blood work and urinalysis.  Patient will be given another liter bolus, we will attempt ambulation again, I do not think this but patient would benefit from an admission I think he needs to go home and orally hydrate. Case signed out to PA Schrossbe at shift change.  Final Clinical Impressions(s) / ED Diagnoses   Final diagnoses:  Dehydration    ED Discharge Orders        Ordered     ondansetron (ZOFRAN ODT) 4 MG disintegrating tablet     02/20/17 1603       Heath Badon, Mardella Layman 02/20/17 1603    Bethann Berkshire, MD 02/20/17 [phone]

## 2017-02-20 NOTE — ED Notes (Signed)
RN asked pt if he could provide urine sample, pt reports he still cannot.  Pt able to tolerate 8oz of PO fluid without difficulty. Pt currently drinking another 8oz at this time.

## 2017-02-20 NOTE — Discharge Instructions (Signed)

## 2017-02-20 NOTE — ED Notes (Signed)
Bed: WA07 Expected date:  Expected time:  Means of arrival:  Comments: 

## 2017-02-20 NOTE — ED Notes (Signed)
Ambulated the patient in the hallway, patient was unsteady while walking

## 2017-02-20 NOTE — ED Triage Notes (Signed)
He awoke to find he was very nauseated, and has been vomiting; also has been somewhat dizzy. Has hx of bowel blockage surgery. He is actively vomiting at triage.

## 2019-02-04 ENCOUNTER — Other Ambulatory Visit: Payer: Self-pay

## 2019-02-04 ENCOUNTER — Ambulatory Visit: Payer: Medicare Other | Attending: Internal Medicine

## 2019-02-04 DIAGNOSIS — Z23 Encounter for immunization: Secondary | ICD-10-CM

## 2019-02-04 NOTE — Progress Notes (Signed)
   Covid-19 Vaccination Clinic  Name:  Arthur Liu    MRN: [id number] DOB: Jun 03, 1940  02/04/2019  Mr. Arthur Liu was observed post Covid-19 immunization for 15 minutes without incidence. He was provided with Vaccine Information Sheet and instruction to access the V-Safe system.   Mr. Arthur Liu was instructed to call 911 with any severe reactions post vaccine: [name] Difficulty breathing  . Swelling of your face and throat  . A fast heartbeat  . A bad rash all over your body  . Dizziness and weakness    Immunizations Administered    Name Date Dose VIS Date Route   Pfizer COVID-19 Vaccine 02/04/2019 12:51 PM 0.3 mL 01/05/2019 Intramuscular   Manufacturer: [employer], [employer]   Lot: [id number]   NDC: [address]-1

## 2019-02-25 ENCOUNTER — Ambulatory Visit: Payer: Medicare PPO | Attending: Internal Medicine

## 2019-02-25 DIAGNOSIS — Z23 Encounter for immunization: Secondary | ICD-10-CM | POA: Insufficient documentation

## 2019-02-25 NOTE — Progress Notes (Signed)
   Covid-19 Vaccination Clinic  Name:  Arthur Liu    MRN: [id number] DOB: 06-10-1940  02/25/2019  Arthur Liu was observed post Covid-19 immunization for 15 minutes without incidence. He was provided with Vaccine Information Sheet and instruction to access the V-Safe system.   Arthur Liu was instructed to call 911 with any severe reactions post vaccine: [name] Difficulty breathing  . Swelling of your face and throat  . A fast heartbeat  . A bad rash all over your body  . Dizziness and weakness    Immunizations Administered    Name Date Dose VIS Date Route   Pfizer COVID-19 Vaccine 02/25/2019 11:47 AM 0.3 mL 01/05/2019 Intramuscular   Manufacturer: [employer], [employer]   Lot: [id number]   NDC: [address]-2

## 2019-07-16 DIAGNOSIS — S80812A Abrasion, left lower leg, initial encounter: Secondary | ICD-10-CM | POA: Diagnosis not present

## 2019-07-16 DIAGNOSIS — L03116 Cellulitis of left lower limb: Secondary | ICD-10-CM | POA: Diagnosis not present

## 2019-07-16 DIAGNOSIS — S81802A Unspecified open wound, left lower leg, initial encounter: Secondary | ICD-10-CM | POA: Diagnosis not present

## 2019-07-16 DIAGNOSIS — L0390 Cellulitis, unspecified: Secondary | ICD-10-CM | POA: Diagnosis not present

## 2019-07-19 DIAGNOSIS — S81802D Unspecified open wound, left lower leg, subsequent encounter: Secondary | ICD-10-CM | POA: Diagnosis not present

## 2019-07-23 DIAGNOSIS — S81802D Unspecified open wound, left lower leg, subsequent encounter: Secondary | ICD-10-CM | POA: Diagnosis not present

## 2019-07-23 DIAGNOSIS — L03116 Cellulitis of left lower limb: Secondary | ICD-10-CM | POA: Diagnosis not present

## 2019-10-05 DIAGNOSIS — J309 Allergic rhinitis, unspecified: Secondary | ICD-10-CM | POA: Diagnosis not present

## 2019-10-05 DIAGNOSIS — E7800 Pure hypercholesterolemia, unspecified: Secondary | ICD-10-CM | POA: Diagnosis not present

## 2019-10-05 DIAGNOSIS — R809 Proteinuria, unspecified: Secondary | ICD-10-CM | POA: Diagnosis not present

## 2019-10-05 DIAGNOSIS — E1169 Type 2 diabetes mellitus with other specified complication: Secondary | ICD-10-CM | POA: Diagnosis not present

## 2019-10-05 DIAGNOSIS — S80812D Abrasion, left lower leg, subsequent encounter: Secondary | ICD-10-CM | POA: Diagnosis not present

## 2019-10-05 DIAGNOSIS — Z79899 Other long term (current) drug therapy: Secondary | ICD-10-CM | POA: Diagnosis not present

## 2019-10-05 DIAGNOSIS — J4520 Mild intermittent asthma, uncomplicated: Secondary | ICD-10-CM | POA: Diagnosis not present

## 2019-10-05 DIAGNOSIS — L0390 Cellulitis, unspecified: Secondary | ICD-10-CM | POA: Diagnosis not present

## 2019-10-05 DIAGNOSIS — Z0000 Encounter for general adult medical examination without abnormal findings: Secondary | ICD-10-CM | POA: Diagnosis not present

## 2019-10-09 DIAGNOSIS — S80822A Blister (nonthermal), left lower leg, initial encounter: Secondary | ICD-10-CM | POA: Diagnosis not present

## 2019-10-11 DIAGNOSIS — S80822D Blister (nonthermal), left lower leg, subsequent encounter: Secondary | ICD-10-CM | POA: Diagnosis not present

## 2019-12-19 DIAGNOSIS — I951 Orthostatic hypotension: Secondary | ICD-10-CM | POA: Diagnosis not present

## 2019-12-21 DIAGNOSIS — R112 Nausea with vomiting, unspecified: Secondary | ICD-10-CM | POA: Diagnosis not present

## 2019-12-21 DIAGNOSIS — R42 Dizziness and giddiness: Secondary | ICD-10-CM | POA: Diagnosis not present

## 2019-12-24 DIAGNOSIS — I4891 Unspecified atrial fibrillation: Secondary | ICD-10-CM | POA: Diagnosis not present

## 2019-12-24 DIAGNOSIS — R42 Dizziness and giddiness: Secondary | ICD-10-CM | POA: Diagnosis not present

## 2019-12-24 DIAGNOSIS — I10 Essential (primary) hypertension: Secondary | ICD-10-CM | POA: Diagnosis not present

## 2019-12-24 DIAGNOSIS — I499 Cardiac arrhythmia, unspecified: Secondary | ICD-10-CM | POA: Diagnosis not present

## 2020-01-02 ENCOUNTER — Encounter: Payer: Self-pay | Admitting: Internal Medicine

## 2020-01-02 ENCOUNTER — Other Ambulatory Visit: Payer: Self-pay

## 2020-01-02 ENCOUNTER — Ambulatory Visit: Payer: Medicare PPO | Admitting: Internal Medicine

## 2020-01-02 VITALS — BP 162/92 | HR 72 | Ht 72.0 in | Wt 240.0 lb

## 2020-01-02 DIAGNOSIS — I484 Atypical atrial flutter: Secondary | ICD-10-CM

## 2020-01-02 DIAGNOSIS — I10 Essential (primary) hypertension: Secondary | ICD-10-CM | POA: Diagnosis not present

## 2020-01-02 DIAGNOSIS — E785 Hyperlipidemia, unspecified: Secondary | ICD-10-CM

## 2020-01-02 DIAGNOSIS — E119 Type 2 diabetes mellitus without complications: Secondary | ICD-10-CM | POA: Insufficient documentation

## 2020-01-02 LAB — TSH: TSH: 2.5 u[IU]/mL (ref 0.450–4.500)

## 2020-01-02 MED ORDER — CARVEDILOL 6.25 MG PO TABS: 6.2500 mg | ORAL_TABLET | Freq: Two times a day (BID) | ORAL | 3 refills | Status: DC

## 2020-01-02 NOTE — Patient Instructions (Signed)
Medication Instructions:  START COREG (CARVEDILOL) 6.25 mg twice a day  STOP ASPIRIN  *If you need a refill on your cardiac medications before your next appointment, please call your pharmacy*   Lab Work: TSH today   If you have labs (blood work) drawn today and your tests are completely normal, you will receive your results only by: [name] MyChart Message (if you have MyChart) OR . A paper copy in the mail If you have any lab test that is abnormal or we need to change your treatment, we will call you to review the results.   Testing/Procedures:  Your physician has requested that you have an echocardiogram. Echocardiography is a painless test that uses sound waves to create images of your heart. It provides your doctor with information about the size and shape of your heart and how well your heart's chambers and valves are working. This procedure takes approximately one hour. There are no restrictions for this procedure.     Follow-Up: At [facility], you and your health needs are our priority.  As part of our continuing mission to provide you with exceptional heart care, we have created designated Provider Care Teams.  These Care Teams include your primary Cardiologist (physician) and Advanced Practice Providers (APPs -  Physician Assistants and Nurse Practitioners) who all work together to provide you with the care you need, when you need it.  We recommend signing up for the patient portal called "MyChart".  Sign up information is provided on this After Visit Summary.  MyChart is used to connect with patients for Virtual Visits (Telemedicine).  Patients are able to view lab/test results, encounter notes, upcoming appointments, etc.  Non-urgent messages can be sent to your provider as well.   To learn more about what you can do with MyChart, go to [email or web address].    Your next appointment:   3 month(s)  The format for your next appointment:   In Person  Provider:   Riley Lam, MD   Other Instructions  Check BP a couple of times a week and let Dr. Izora Ribas know if staying above 135/85.

## 2020-01-02 NOTE — Progress Notes (Signed)
Cardiology Office Note:    Date:  01/02/2020   ID:  Arthur Liu, DOB [birth year], MRN [id number]  PCP:  Catha Gosselin, MD  [facility] Cardiologist:  No primary care provider on file.  [facility] Electrophysiologist:  None   CC: Eval for A Flutter Consulted for the evaluation of fluttering NOS at the behest of Catha Gosselin, MD  History of Present Illness:    Arthur Liu is a 79 y.o. male with a hx of Diabetes and HLD, AF NOS, and Asthma who presents for evaluation.  Patient notes that he is feeling good.  Has had no chest pain, chest pressure, chest tightness, chest stinging.   Patient exertion notable for being able to do 16 stairs multiple times a day  and feels no symptoms.  No shortness of breath, DOE.  No PND or orthopnea.  No weight gain, leg swelling, or abdominal swelling.  Had one evaluation for dehydration and dizziness (that has resolved with hydration) and needed to see PCP; was found to have AFl incidentally at that time.  Started on [facility] without bleeding issues. Notes  no palpitations or funny heart beats.   Cannot tell when he is in AFl.  Patient reports prior cardiac testing including normal stress test 10-12 years prior ([facility] system).  Ambulatory BP Not Done.  Incidentally found to have high BP at PCP office as well.  Past Medical History:  Diagnosis Date  . Allergic rhinitis   . Asthma   . Atrial fibrillation (HCC)   . Body mass index (BMI) 32.0-32.9, adult   . Diabetes mellitus without complication (HCC)   . DM (diabetes mellitus) (HCC)   . Elevated blood pressure reading   . GERD (gastroesophageal reflux disease)   . High cholesterol   . Hypercholesterolemia   . Hyperlipidemia   . Irregular heart rhythm   . Microalbuminuria   . Nasal polyposis   . Obesity   . Plantar wart   . Small bowel obstruction [facility])     Past Surgical History:  Procedure Laterality Date  . ABDOMINAL EXPLORATION SURGERY  2010   found blockage on cxr---surgery showed  nothing  . CYST EXCISION  1966    Current Medications: Current Meds  Medication Sig  . acetaminophen (TYLENOL) 500 MG tablet Take 1,000 mg by mouth every 6 (six) hours as needed for mild pain or headache.  . albuterol (PROVENTIL HFA;VENTOLIN HFA) 108 (90 BASE) MCG/ACT inhaler Inhale 2 puffs into the lungs every 6 (six) hours as needed for wheezing.  [name] apixaban (ELIQUIS) 5 MG TABS tablet Take 5 mg by mouth 2 (two) times daily.  . meclizine (ANTIVERT) 25 MG tablet Take 25 mg by mouth 3 (three) times daily as needed for dizziness.  . ondansetron (ZOFRAN ODT) 4 MG disintegrating tablet 4mg  ODT q4 hours prn nausea/vomit  . rosuvastatin (CRESTOR) 20 MG tablet Take 20 mg by mouth daily.  . [DISCONTINUED] aspirin EC 81 MG tablet Take 81 mg by mouth daily.  . [DISCONTINUED] mometasone-formoterol (DULERA) 100-5 MCG/ACT AERO Take 2 puffs first thing in am    Allergies:   Patient has no known allergies.   Social History   Socioeconomic History  . Marital status: Married    Spouse name: Not on file  . Number of children: 2  . Years of education: Not on file  . Highest education level: Not on file  Occupational History  . 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 Smoking status: Never Smoker  .  Smokeless tobacco: Never Used  Substance and Sexual Activity  . Alcohol use: Yes    Comment: occassional//rare-about 2 glasses of white wine a month  . Drug use: No  . Sexual activity: Never  Other Topics Concern  . Not on file  Social History Narrative  . Not on file   Social Determinants of Health   Financial Resource Strain:   . Difficulty of Paying Living Expenses: Not on file  Food Insecurity:   . Worried About Programme researcher, broadcasting/film/video in the Last Year: Not on file  . Ran Out of Food in the Last Year: Not on file  Transportation Needs:   . Lack of Transportation (Medical): Not on file  . Lack of Transportation (Non-Medical): Not on file  Physical Activity:   .  Days of Exercise per Week: Not on file  . Minutes of Exercise per Session: Not on file  Stress:   . Feeling of Stress : Not on file  Social Connections:   . Frequency of Communication with Friends and Family: Not on file  . Frequency of Social Gatherings with Friends and Family: Not on file  . Attends Religious Services: Not on file  . Active Member of Clubs or Organizations: Not on file  . Attends Banker Meetings: Not on file  . 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 No hx of atrial fibrillation or atrial flutter  ROS:   Please see the history of present illness.     All other systems reviewed and are negative.  EKGs/Labs/Other Studies Reviewed:    The following studies were reviewed today:  EKG:  EKG is ordered today.  The ekg ordered today demonstrates Atrial Flutter with Variable Conduction 72  10/19/2010- SR rate 76 without ST/T changes  Recent Labs: No results found for requested labs within last 8760 hours.  Recent Lipid Panel    Component Value Date/Time   CHOL 104 10/19/2010 0645   TRIG 53 10/19/2010 0645   HDL 37 (L) 10/19/2010 0645   CHOLHDL 2.8 10/19/2010 0645   VLDL 11 10/19/2010 0645   LDLCALC 56 10/19/2010 0645    Risk Assessment/Calculations:     CHA2DS2-VASc Score = 4  This indicates a 4.8% annual risk of stroke. The patient's score is based upon: CHF History: 0 HTN History: 1 Diabetes History: 1 Stroke History: 0 Vascular Disease History: 0 Age Score: 2 Gender Score: 0      Physical Exam:    VS:  BP (!) 162/92   Pulse 72   Ht 6' (1.829 m)   Wt 240 lb (108.9 kg)   BMI 32.55 kg/m     Wt Readings from Last 3 Encounters:  01/02/20 240 lb (108.9 kg)  10/08/14 217 lb (98.4 kg)  04/30/14 250 lb 11.2 oz (113.7 kg)     GEN: Well  nourished, well developed in no acute distress HEENT: Normal NECK: No JVD; No carotid bruits LYMPHATICS: No lymphadenopathy CARDIAC: irregularly irregular, no murmurs, rubs, gallops RESPIRATORY:  Clear to auscultation without rales, wheezing or rhonchi  ABDOMEN: Soft, non-tender, non-distended MUSCULOSKELETAL:  No edema; No deformity  SKIN: Warm and dry NEUROLOGIC:  Alert and oriented x 3 PSYCHIATRIC:  Normal affect   ASSESSMENT:    1. Atypical atrial flutter (HCC)   2. Diabetes mellitus with coincident  hypertension (HCC)   3. Hyperlipidemia, unspecified hyperlipidemia type    PLAN:    In order of problems listed above:  Atrial Flutter  - CHADSVASC=4. - TSH Not Done, imaging not done, will order TSH - Discussed alcohol and exercise as preventive factors - OSA Risk Low  - Will get obtain TTE.   - Continue anticoagulation with Eliquis stop ASA  - Start rate control with Carvedilol 6.25 mg PO BID - Rhythm control options deferred presently - Considerations for DCCV after Echo or with new symptoms - Consideration for ablation deferred presently  Diabetes with Hypertension HLD - ambulatory blood pressure not done, will start ambulatory BP monitoring; gave education on how to perform ambulatory blood pressure monitoring including the frequency and technique; goal ambulatory blood pressure < 135/85 on average - New Coreg as above - discussed diet (DASH/low sodium), and exercise/weight loss interventions  - continue statin LDL < 70  3 months follow up unless new symptoms or abnormal test results warranting change in plan  Would be reasonable for  APP Follow up  Shared Decision Making/Informed Consent      Patient amenable to plan  Medication Adjustments/Labs and Tests Ordered: Current medicines are reviewed at length with the patient today.  Concerns regarding medicines are outlined above.  Orders Placed This Encounter  Procedures  . TSH  . EKG 12-Lead  . ECHOCARDIOGRAM  COMPLETE   Meds ordered this encounter  Medications  . carvedilol (COREG) 6.25 MG tablet    Sig: Take 1 tablet (6.25 mg total) by mouth 2 (two) times daily.    Dispense:  180 tablet    Refill:  3    Patient Instructions  Medication Instructions:  START COREG (CARVEDILOL) 6.25 mg twice a day  STOP ASPIRIN  *If you need a refill on your cardiac medications before your next appointment, please call your pharmacy*   Lab Work: TSH today   If you have labs (blood work) drawn today and your tests are completely normal, you will receive your results only by: [name]. MyChart Message (if you have MyChart) OR . A paper copy in the mail If you have any lab test that is abnormal or we need to change your treatment, we will call you to review the results.   Testing/Procedures:  Your physician has requested that you have an echocardiogram. Echocardiography is a painless test that uses sound waves to create images of your heart. It provides your doctor with information about the size and shape of your heart and how well your heart's chambers and valves are working. This procedure takes approximately one hour. There are no restrictions for this procedure.     Follow-Up: At [facility], you and your health needs are our priority.  As part of our continuing mission to provide you with exceptional heart care, we have created designated Provider Care Teams.  These Care Teams include your primary Cardiologist (physician) and Advanced Practice Providers (APPs -  Physician Assistants and Nurse Practitioners) who all work together to provide you with the care you need, when you need it.  We recommend signing up for the patient portal called "MyChart".  Sign up information is provided on this After Visit Summary.  MyChart is used to connect with patients for Virtual Visits (Telemedicine).  Patients are able to view lab/test results, encounter notes, upcoming appointments, etc.  Non-urgent messages can be sent  to your provider as well.   To learn more about what you can do with MyChart, go  to [email or web address].    Your next appointment:   3 month(s)  The format for your next appointment:   In Person  Provider:   Riley Lam, MD   Other Instructions  Check BP a couple of times a week and let Dr. Izora Ribas know if staying above 135/85.       Signed, Christell Constant, MD  01/02/2020 8:48 AM    [facility]

## 2020-02-05 ENCOUNTER — Ambulatory Visit (HOSPITAL_COMMUNITY): Payer: Medicare PPO | Attending: Cardiovascular Disease

## 2020-02-05 ENCOUNTER — Other Ambulatory Visit: Payer: Self-pay

## 2020-02-05 DIAGNOSIS — I10 Essential (primary) hypertension: Secondary | ICD-10-CM | POA: Diagnosis not present

## 2020-02-05 DIAGNOSIS — E785 Hyperlipidemia, unspecified: Secondary | ICD-10-CM | POA: Diagnosis not present

## 2020-02-05 DIAGNOSIS — E119 Type 2 diabetes mellitus without complications: Secondary | ICD-10-CM | POA: Insufficient documentation

## 2020-02-05 DIAGNOSIS — I484 Atypical atrial flutter: Secondary | ICD-10-CM

## 2020-02-05 LAB — ECHOCARDIOGRAM COMPLETE
AR max vel: 1.7 cm2
AV Area VTI: 1.82 cm2
AV Area mean vel: 1.69 cm2
AV Mean grad: 6.9 mmHg
AV Peak grad: 11.6 mmHg
Ao pk vel: 1.7 m/s
S' Lateral: 2.7 cm

## 2020-02-06 ENCOUNTER — Telehealth: Payer: Self-pay | Admitting: Internal Medicine

## 2020-02-06 NOTE — Telephone Encounter (Signed)
Patient is returning call to get results. Please call back.

## 2020-02-06 NOTE — Telephone Encounter (Signed)
Christell Constant, MD  02/05/2020 5:01 PM EST      Results: No evidence of flutter related heart damage Plan: Continue current plan  Christell Constant, MD   The patient has been notified of the result and verbalized understanding.  All questions (if any) were answered. Sampson Goon, RN 02/06/2020 11:46 AM

## 2020-04-02 ENCOUNTER — Other Ambulatory Visit: Payer: Self-pay

## 2020-04-02 ENCOUNTER — Ambulatory Visit: Payer: Medicare PPO | Admitting: Internal Medicine

## 2020-04-02 ENCOUNTER — Encounter: Payer: Self-pay | Admitting: Internal Medicine

## 2020-04-02 VITALS — BP 118/86 | HR 60 | Ht 72.0 in | Wt 245.0 lb

## 2020-04-02 DIAGNOSIS — I10 Essential (primary) hypertension: Secondary | ICD-10-CM

## 2020-04-02 DIAGNOSIS — E119 Type 2 diabetes mellitus without complications: Secondary | ICD-10-CM | POA: Diagnosis not present

## 2020-04-02 DIAGNOSIS — E785 Hyperlipidemia, unspecified: Secondary | ICD-10-CM

## 2020-04-02 DIAGNOSIS — I350 Nonrheumatic aortic (valve) stenosis: Secondary | ICD-10-CM | POA: Diagnosis not present

## 2020-04-02 DIAGNOSIS — I484 Atypical atrial flutter: Secondary | ICD-10-CM

## 2020-04-02 MED ORDER — APIXABAN 5 MG PO TABS: 5.0000 mg | ORAL_TABLET | Freq: Two times a day (BID) | ORAL | 1 refills | Status: DC

## 2020-04-02 MED ORDER — CARVEDILOL 6.25 MG PO TABS: 6.2500 mg | ORAL_TABLET | Freq: Two times a day (BID) | ORAL | 3 refills | Status: DC

## 2020-04-02 MED ORDER — ROSUVASTATIN CALCIUM 20 MG PO TABS: 20.0000 mg | ORAL_TABLET | Freq: Every day | ORAL | 1 refills | Status: DC

## 2020-04-02 NOTE — Patient Instructions (Addendum)
Medication Instructions:  Your physician recommends that you continue on your current medications as directed. Please refer to the Current Medication list given to you today.  *If you need a refill on your cardiac medications before your next appointment, please call your pharmacy*   Lab Work: none If you have labs (blood work) drawn today and your tests are completely normal, you will receive your results only by: [name] MyChart Message (if you have MyChart) OR . A paper copy in the mail If you have any lab test that is abnormal or we need to change your treatment, we will call you to review the results.   Testing/Procedures: Echocardiogram in 11 months   Follow-Up: At [facility], you and your health needs are our priority.  As part of our continuing mission to provide you with exceptional heart care, we have created designated Provider Care Teams.  These Care Teams include your primary Cardiologist (physician) and Advanced Practice Providers (APPs -  Physician Assistants and Nurse Practitioners) who all work together to provide you with the care you need, when you need it.  We recommend signing up for the patient portal called "MyChart".  Sign up information is provided on this After Visit Summary.  MyChart is used to connect with patients for Virtual Visits (Telemedicine).  Patients are able to view lab/test results, encounter notes, upcoming appointments, etc.  Non-urgent messages can be sent to your provider as well.   To learn more about what you can do with MyChart, go to [email or web address].    Your next appointment:   1 year(s)  The format for your next appointment:   In Person  Provider:   Riley Lam, MD   Other Instructions

## 2020-04-02 NOTE — Progress Notes (Signed)
Cardiology Office Note:    Date:  04/02/2020   ID:  Arthur Liu, DOB February 21, 1940, MRN [id number]  PCP:  Catha Gosselin, MD  [facility] Cardiologist:  Riley Lam MD [facility] Electrophysiologist:  None   CC: Follow up AFl  History of Present Illness:    Arthur Liu is a 80 y.o. male with a hx of Diabetes and HLD, Persistent AFl, and Asthma who presents for evaluation 01/02/20.  In interim of this visit, patient had normal echo- seen 04/02/20.  Patient notes that  he is doing well.  Since last visit notes  That he can still walk 45 minutes and has no change in activity; still consulting with community colleagues.    Relevant interval testing or therapy include the echocardiogram.  There are no interval hospital/ED visit.    No chest pain or pressure.  No SOB/DOE and no PND/Orthopnea. No palpitations or syncope.  No bleeding issues  Ambulatory blood pressure 130/80. Pulse has been 60-71.    Past Medical History:  Diagnosis Date  . Allergic rhinitis   . Asthma   . Atrial fibrillation (HCC)   . Body mass index (BMI) 32.0-32.9, adult   . Diabetes mellitus without complication (HCC)   . DM (diabetes mellitus) (HCC)   . Elevated blood pressure reading   . GERD (gastroesophageal reflux disease)   . High cholesterol   . Hypercholesterolemia   . Hyperlipidemia   . Irregular heart rhythm   . Microalbuminuria   . Nasal polyposis   . Obesity   . Plantar wart   . Small bowel obstruction [facility])     Past Surgical History:  Procedure Laterality Date  . ABDOMINAL EXPLORATION SURGERY  2010   found blockage on cxr---surgery showed nothing  . CYST EXCISION  1966    Current Medications: Current Meds  Medication Sig  . acetaminophen (TYLENOL) 500 MG tablet Take 1,000 mg by mouth every 6 (six) hours as needed for mild pain or headache.  . albuterol (PROVENTIL HFA;VENTOLIN HFA) 108 (90 BASE) MCG/ACT inhaler Inhale 2 puffs into the lungs every 6 (six) hours as needed for  wheezing.  [name] apixaban (ELIQUIS) 5 MG TABS tablet Take 5 mg by mouth 2 (two) times daily.  . carvedilol (COREG) 6.25 MG tablet Take 1 tablet (6.25 mg total) by mouth 2 (two) times daily.  . rosuvastatin (CRESTOR) 20 MG tablet Take 20 mg by mouth daily.    Allergies:   Patient has no known allergies.   Social History   Socioeconomic History  . Marital status: Married    Spouse name: Not on file  . Number of children: 2  . Years of education: Not on file  . Highest education level: Not on file  Occupational History  . 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 Smoking status: Never Smoker  . Smokeless tobacco: Never Used  Substance and Sexual Activity  . Alcohol use: Yes    Comment: occassional//rare-about 2 glasses of white wine a month  . Drug use: No  . Sexual activity: Never  Other Topics Concern  . Not on file  Social History Narrative  . 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 No hx of atrial fibrillation or atrial flutter  ROS:   Please see the history of present illness.     All other systems reviewed and are negative.  EKGs/Labs/Other Studies Reviewed:    The following studies were reviewed today:  EKG:  04/02/20:  AFl  Variable conduction rate 69 01/02/20 Atrial Flutter with Variable Conduction 72  10/19/2010- SR rate 76 without ST/T changes  Transthoracic Echocardiogram: Date: 02/05/20 Results: 1. Left ventricular ejection fraction, by estimation, is 55 to 60%. The  left ventricle has normal function. The left ventricle has no  regional  wall motion abnormalities. There is mild concentric left ventricular  hypertrophy. Left ventricular diastolic  function could not be evaluated.  2. Right ventricular systolic function is normal. The right ventricular  size is mildly enlarged. Tricuspid regurgitation signal is inadequate for  assessing PA pressure.  3. The mitral valve is grossly normal. Mild mitral valve regurgitation.  No evidence of mitral stenosis.  4. The aortic valve is tricuspid. There is moderate calcification of the  aortic valve. There is moderate thickening of the aortic valve. Aortic  valve regurgitation is not visualized. Mild aortic valve stenosis. Aortic  valve area, by VTI measures 1.82  cm. Aortic valve mean gradient measures 6.9 mmHg. Aortic valve Vmax  measures 1.70 m/s.  5. The inferior vena cava is normal in size with greater than 50%  respiratory variability, suggesting right atrial pressure of 3 mmHg.  Recent Labs: 01/02/2020: TSH 2.500  Recent Lipid Panel    Component Value Date/Time   CHOL 104 10/19/2010 0645   TRIG 53 10/19/2010 0645   HDL 37 (L) 10/19/2010 0645   CHOLHDL 2.8 10/19/2010 0645   VLDL 11 10/19/2010 0645   LDLCALC 56 10/19/2010 0645   Risk Assessment/Calculations:     CHA2DS2-VASc Score = 4  This indicates a 4.8% annual risk of stroke. The patient's score is based upon: CHF History: No HTN History: Yes Diabetes History: Yes Stroke History: No Vascular Disease History: No Age Score: 2 Gender Score: 0      Physical Exam:    VS:  BP 118/86   Pulse 60   Ht 6' (1.829 m)   Wt 245 lb (111.1 kg)   BMI 33.23 kg/m     Wt Readings from Last 3 Encounters:  04/02/20 245 lb (111.1 kg)  01/02/20 240 lb (108.9 kg)  10/08/14 217 lb (98.4 kg)    GEN: Appears younger than stated age; well developed in no acute distress HEENT: Normal NECK: No JVD; No carotid bruits LYMPHATICS: No lymphadenopathy CARDIAC:Regular rate and rhythm, no murmurs, rubs, gallops   RESPIRATORY:  Clear to auscultation without rales, wheezing or rhonchi  ABDOMEN: Soft, non-tender, non-distended MUSCULOSKELETAL:  No edema; No deformity  SKIN: Warm and dry NEUROLOGIC:  Alert and oriented x 3 PSYCHIATRIC:  Normal affect   ASSESSMENT:    1. Atypical atrial flutter (HCC)   2. Diabetes mellitus with coincident hypertension (HCC)   3. Hyperlipidemia, unspecified hyperlipidemia type   4. Severe aortic stenosis    PLAN:    In order of problems listed above:  Persistent Atrial Flutter - CHADSVASC=4. - Discussed alcohol and exercise as preventive factors - OSA Risk Low  - no significant atrial remodeling  - presently in AFl - Continue anticoagulation with Eliquis - Continue Carvedilol 6.25 mg PO BID SHARED DECISION MAKING- discussed DCCV  vs conservative therapy; will get echo in 11 months if asymptomatic to look for remodeling that could warrant more aggressive therapy; patients knows to look for symptoms including fatigue, SOB, and palpitations and to alert [place] if these occur  Diabetes with Hypertension HLD - ambulatory blood pressure 110/80, will continue ambulatory BP monitoring; gave education on how to perform ambulatory blood pressure monitoring including the frequency and technique; goal ambulatory blood pressure < 135/85 on average - discussed diet (DASH/low sodium), and exercise/weight loss interventions  - continue statin LDL < 70   Mild Aortic Stenosis - Three year monitoring unless new symptoms emerge  One year follow up unless new symptoms or abnormal test results warranting change in plan  Would be reasonable for  APP Follow up   Medication Adjustments/Labs and Tests Ordered: Current medicines are reviewed at length with the patient today.  Concerns regarding medicines are outlined above.  Orders Placed This Encounter  Procedures  . EKG 12-Lead  . ECHOCARDIOGRAM COMPLETE   No orders of the defined types were placed in this  encounter.   Patient Instructions  Medication Instructions:  Your physician recommends that you continue on your current medications as directed. Please refer to the Current Medication list given to you today.  *If you need a refill on your cardiac medications before your next appointment, please call your pharmacy*   Lab Work: none If you have labs (blood work) drawn today and your tests are completely normal, you will receive your results only by: [name] MyChart Message (if you have MyChart) OR . A paper copy in the mail If you have any lab test that is abnormal or we need to change your treatment, we will call you to review the results.   Testing/Procedures: Echocardiogram in 11 months   Follow-Up: At [facility], you and your health needs are our priority.  As part of our continuing mission to provide you with exceptional heart care, we have created designated Provider Care Teams.  These Care Teams include your primary Cardiologist (physician) and Advanced Practice Providers (APPs -  Physician Assistants and Nurse Practitioners) who all work together to provide you with the care you need, when you need it.  We recommend signing up for the patient portal called "MyChart".  Sign up information is provided on this After Visit Summary.  MyChart is used to connect with patients for Virtual Visits (Telemedicine).  Patients are able to view lab/test results, encounter notes, upcoming appointments, etc.  Non-urgent messages can be sent to your provider as well.   To learn more about what you can do with MyChart, go to [email or web address].    Your next appointment:   1 year(s)  The format for your next appointment:   In Person  Provider:   Riley Lam, MD   Other Instructions      Signed, Christell Constant, MD  04/02/2020 8:36 AM    [facility]

## 2020-05-26 DIAGNOSIS — R059 Cough, unspecified: Secondary | ICD-10-CM | POA: Diagnosis not present

## 2020-05-26 DIAGNOSIS — U071 COVID-19: Secondary | ICD-10-CM | POA: Diagnosis not present

## 2020-05-26 DIAGNOSIS — R062 Wheezing: Secondary | ICD-10-CM | POA: Diagnosis not present

## 2020-07-04 DIAGNOSIS — R609 Edema, unspecified: Secondary | ICD-10-CM | POA: Diagnosis not present

## 2020-07-04 DIAGNOSIS — E7800 Pure hypercholesterolemia, unspecified: Secondary | ICD-10-CM | POA: Diagnosis not present

## 2020-07-04 DIAGNOSIS — I4891 Unspecified atrial fibrillation: Secondary | ICD-10-CM | POA: Diagnosis not present

## 2020-07-04 DIAGNOSIS — E1169 Type 2 diabetes mellitus with other specified complication: Secondary | ICD-10-CM | POA: Diagnosis not present

## 2020-07-04 DIAGNOSIS — I10 Essential (primary) hypertension: Secondary | ICD-10-CM | POA: Diagnosis not present

## 2020-07-24 DIAGNOSIS — H40013 Open angle with borderline findings, low risk, bilateral: Secondary | ICD-10-CM | POA: Diagnosis not present

## 2020-07-24 DIAGNOSIS — E119 Type 2 diabetes mellitus without complications: Secondary | ICD-10-CM | POA: Diagnosis not present

## 2020-09-25 DIAGNOSIS — E7800 Pure hypercholesterolemia, unspecified: Secondary | ICD-10-CM | POA: Diagnosis not present

## 2020-09-25 DIAGNOSIS — I4891 Unspecified atrial fibrillation: Secondary | ICD-10-CM | POA: Diagnosis not present

## 2020-09-25 DIAGNOSIS — Z0000 Encounter for general adult medical examination without abnormal findings: Secondary | ICD-10-CM | POA: Diagnosis not present

## 2020-09-25 DIAGNOSIS — I10 Essential (primary) hypertension: Secondary | ICD-10-CM | POA: Diagnosis not present

## 2020-09-25 DIAGNOSIS — E119 Type 2 diabetes mellitus without complications: Secondary | ICD-10-CM | POA: Diagnosis not present

## 2020-11-25 ENCOUNTER — Telehealth: Payer: Self-pay | Admitting: Internal Medicine

## 2020-11-25 NOTE — Telephone Encounter (Signed)
   Patient Name: Arthur Liu  DOB: 1941-01-01 MRN: [id number]  Primary Cardiologist: Christell Constant, MD  Chart reviewed as part of pre-operative protocol coverage.   Simple dental extractions are considered low risk procedures per guidelines and generally do not require any specific cardiac clearance. It is also generally accepted that for simple extractions and dental cleanings, there is no need to interrupt blood thinner therapy.   SBE prophylaxis is not required for the patient from a cardiac standpoint.  I will route this recommendation to the requesting party via Epic fax function and remove from pre-op pool.  Please call with questions.  Roe Rutherford Shun Pletz, PA 11/25/2020, 3:40 PM

## 2020-11-25 NOTE — Telephone Encounter (Signed)
   [facility] Pre-operative Risk Assessment    Patient Name: Arthur Liu  DOB: 17-Mar-1940 MRN: [id number]  HEARTCARE STAFF:  - IMPORTANT!!!!!! Under Visit Info/Reason for Call, type in Other and utilize the format Clearance MM/DD/YY or Clearance TBD. Do not use dashes or single digits. - Please review there is not already an duplicate clearance open for this procedure. - If request is for dental extraction, please clarify the # of teeth to be extracted. - If the patient is currently at the dentist's office, call Pre-Op Callback Staff (MA/nurse) to input urgent request.  - If the patient is not currently in the dentist office, please route to the Pre-Op pool.  Request for surgical clearance:  What type of surgery is being performed? 1 tooth extracted #11  When is this surgery scheduled? TBD  What type of clearance is required (medical clearance vs. Pharmacy clearance to hold med vs. Both)? Pharmacy  Are there any medications that need to be held prior to surgery and how long? Eliquis, need guidance on whether it needs to be held  Practice name and name of physician performing surgery? [facility], Dr. Marily Memos   What is the office phone number? [phone]   7.   What is the office fax number? [phone]  8.   Anesthesia type (None, local, MAC, general) ? local   Selena Zobro 11/25/2020, 2:08 PM  _________________________________________________________________   (provider comments below)

## 2020-11-29 ENCOUNTER — Other Ambulatory Visit: Payer: Self-pay | Admitting: Internal Medicine

## 2021-01-23 ENCOUNTER — Other Ambulatory Visit: Payer: Self-pay | Admitting: Internal Medicine

## 2021-01-23 NOTE — Telephone Encounter (Signed)
Eliquis 5 mg refill request received. Patient is 80 years old, weight-111.1 kg, Crea- 0.98 on 07/04/20 via [facility] , Diagnosis- atrial flutter, and last seen by Dr. Izora Ribas on 04/02/20. Dose is appropriate based on dosing criteria. Will send in refill to requested pharmacy.

## 2021-03-11 ENCOUNTER — Other Ambulatory Visit: Payer: Self-pay

## 2021-03-11 ENCOUNTER — Ambulatory Visit (HOSPITAL_COMMUNITY): Payer: Medicare PPO | Attending: Internal Medicine

## 2021-03-11 DIAGNOSIS — I484 Atypical atrial flutter: Secondary | ICD-10-CM | POA: Insufficient documentation

## 2021-03-11 LAB — ECHOCARDIOGRAM COMPLETE
AR max vel: 1.35 cm2
AV Area VTI: 1.36 cm2
AV Area mean vel: 1.4 cm2
AV Mean grad: 7.6 mmHg
AV Peak grad: 14.8 mmHg
Ao pk vel: 1.93 m/s
Area-P 1/2: 3.86 cm2
S' Lateral: 3.3 cm

## 2021-03-12 ENCOUNTER — Telehealth: Payer: Self-pay | Admitting: *Deleted

## 2021-03-12 DIAGNOSIS — I350 Nonrheumatic aortic (valve) stenosis: Secondary | ICD-10-CM

## 2021-03-12 NOTE — Telephone Encounter (Signed)
Left message to call office

## 2021-03-12 NOTE — Telephone Encounter (Signed)
I spoke with patient. He is feeling good.  No chest pain, dizziness or shortness of breath. Will forward to Dr Izora Ribas to clarify testing needed.

## 2021-03-12 NOTE — Telephone Encounter (Signed)
Patient returned call

## 2021-03-12 NOTE — Telephone Encounter (Signed)
-----   Message from Christell Constant, MD sent at 03/12/2021  9:49 AM EST ----- Results: Stable LV function Similar SVI but with decreased DVI.  Suspect moderate AS Still in AFL Aortic Valve appears more restricted that prior Plan: If asymptomatic, will get coronary artery calcium score prior to next visit (consideration for Progress Trial) If symptomatic, needs TAVR CT  Christell Constant, MD

## 2021-03-13 NOTE — Telephone Encounter (Signed)
Called pt explained MD rationale for Coronary Calcium score test.  Pt is agreeable to have testing restricted on the days that test can be performed.  Advised pt to let scheduler know what days work good when called to schedule testing.   All questions answered and order placed.

## 2021-03-27 ENCOUNTER — Other Ambulatory Visit: Payer: Self-pay

## 2021-03-27 ENCOUNTER — Ambulatory Visit (INDEPENDENT_AMBULATORY_CARE_PROVIDER_SITE_OTHER)
Admission: RE | Admit: 2021-03-27 | Discharge: 2021-03-27 | Disposition: A | Discharge status: Home / Self Care | Payer: Self-pay | Source: Ambulatory Visit | Attending: Internal Medicine | Admitting: Internal Medicine

## 2021-03-27 DIAGNOSIS — I350 Nonrheumatic aortic (valve) stenosis: Secondary | ICD-10-CM

## 2021-03-30 ENCOUNTER — Ambulatory Visit: Payer: Medicare PPO | Admitting: Internal Medicine

## 2021-03-30 NOTE — Progress Notes (Signed)
Cardiology Office Note:    Date:  04/03/2021   ID:  Arthur Liu, DOB [birth year], MRN [id number]  PCP:  Catha Gosselin, MD  [facility] Cardiologist:  Riley Lam MD [facility] Electrophysiologist:  None   CC: Follow up AFL, and Moderate AS  History of Present Illness:    Arthur Liu is a 81 y.o. male with a hx of Diabetes and HLD, Persistent AFl, and Asthma who presents for evaluation 01/02/20.   At last visit had elevated gradients. CAC score done showing CAD and AV Calcium.  Patient notes that he is doing well.   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  There are no interval hospital/ED visit.    No chest pain or pressure .  No SOB/DOE and no PND/Orthopnea.  No weight gain or leg swelling.  No palpitations or syncope despite being in AFL.   Past Medical History:  Diagnosis Date   Allergic rhinitis    Asthma    Atrial fibrillation (HCC)    Body mass index (BMI) 32.0-32.9, adult    Diabetes mellitus without complication (HCC)    DM (diabetes mellitus) (HCC)    Elevated blood pressure reading    GERD (gastroesophageal reflux disease)    High cholesterol    Hypercholesterolemia    Hyperlipidemia    Irregular heart rhythm    Microalbuminuria    Nasal polyposis    Obesity    Plantar wart    Small bowel obstruction (HCC)     Past Surgical History:  Procedure Laterality Date   ABDOMINAL EXPLORATION SURGERY  2010   found blockage on cxr---surgery showed nothing   CYST EXCISION  1966    Current Medications: Current Meds  Medication Sig   acetaminophen (TYLENOL) 500 MG tablet Take 1,000 mg by mouth every 6 (six) hours as needed for mild pain or headache.   albuterol (PROVENTIL HFA;VENTOLIN HFA) 108 (90 BASE) MCG/ACT inhaler Inhale 2 puffs into the lungs every 6 (six) hours as needed for wheezing.   carvedilol (COREG) 6.25 MG tablet Take 1 tablet (6.25 mg total) by mouth 2 (two) times daily.   ELIQUIS 5 MG TABS tablet  TAKE 1 TABLET BY MOUTH TWICE A DAY   rosuvastatin (CRESTOR) 20 MG tablet TAKE 1 TABLET BY MOUTH EVERY DAY    Allergies:   Patient has no known allergies.   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  Tobacco Use   Smoking status: Never   Smokeless tobacco: Never  Substance and Sexual Activity   Alcohol use: Yes    Comment: occassional//rare-about 2 glasses of white wine a month   Drug use: No   Sexual activity: Never  Other Topics Concern   Not on file  Social History Narrative   Not on file   Social Determinants of Health   Financial Resource Strain: Not on file  Food Insecurity: Not on file  Transportation Needs: Not on file  Physical Activity: Not on file  Stress: Not on file  Social Connections: Not on file    Social: Former Economist of a [employer], received a lifetime achievement/hall of fame award for [employer] in [place]  Family History: The patient's family history includes Heart attack in his father and  mother; Heart disease in his brother; Prostate cancer in his brother. No hx of atrial fibrillation or atrial flutter  ROS:   Please see the history of present illness.     All other systems reviewed and are negative.  EKGs/Labs/Other Studies Reviewed:    The following studies were reviewed today:  EKG:  04/03/21: rate 66 AFL 04/02/20:  AFl  Variable conduction rate 69 01/02/20 Atrial Flutter with Variable Conduction 72  10/19/2010- SR rate 76 without ST/T changes  Transthoracic Echocardiogram: Date: 02/05/20 Results:  1. Left ventricular ejection fraction, by estimation, is 55 to 60%. The  left ventricle has normal function. The left ventricle has no regional  wall motion abnormalities. There is mild concentric left  ventricular  hypertrophy. Left ventricular diastolic  function could not be evaluated.   2. Right ventricular systolic function is normal. The right ventricular  size is mildly enlarged. Tricuspid regurgitation signal is inadequate for  assessing PA pressure.   3. The mitral valve is grossly normal. Mild mitral valve regurgitation.  No evidence of mitral stenosis.   4. The aortic valve is tricuspid. There is moderate calcification of the  aortic valve. There is moderate thickening of the aortic valve. Aortic  valve regurgitation is not visualized. Mild aortic valve stenosis. Aortic  valve area, by VTI measures 1.82  cm. Aortic valve mean gradient measures 6.9 mmHg. Aortic valve Vmax  measures 1.70 m/s.   5. The inferior vena cava is normal in size with greater than 50%  respiratory variability, suggesting right atrial pressure of 3 mmHg.  Recent Labs: No results found for requested labs within last 8760 hours.  Recent Lipid Panel    Component Value Date/Time   CHOL 104 10/19/2010 0645   TRIG 53 10/19/2010 0645   HDL 37 (L) 10/19/2010 0645   CHOLHDL 2.8 10/19/2010 0645   VLDL 11 10/19/2010 0645   LDLCALC 56 10/19/2010 0645     Physical Exam:    VS:  BP 140/80    Pulse 66    Ht 6' (1.829 m)    Wt 254 lb (115.2 kg)    SpO2 98%    BMI 34.45 kg/m     Wt Readings from Last 3 Encounters:  04/03/21 254 lb (115.2 kg)  04/02/20 245 lb (111.1 kg)  01/02/20 240 lb (108.9 kg)    Gen: no distress  Neck: No JVD Cardiac: No Rubs or Gallops, new systolic murmur, RRR Respiratory: Clear to auscultation bilaterally, normal effort, normal  respiratory rate GI: Soft, nontender, non-distended  MS: No edema;  moves all extremities Integument: Skin feels warm Neuro:  At time of evaluation, alert and oriented to person/place/time/situation  Psych: Normal affect, patient feels    ASSESSMENT:    1. Nonrheumatic aortic (valve) stenosis   2. Diabetes mellitus with coincident hypertension  (HCC)   3. Atypical atrial flutter (HCC)   4. Hyperlipidemia, unspecified hyperlipidemia type     PLAN:    Moderate Aortic Stenosis Long standing Persistent Atrial Flutter Diabetes with Hypertension HLD CAC Echo in one year - CHADSVASC=4. - no significant atrial remodeling and preserved LVEF - presently in AFL asymptomatic, rate controlled on coreg 6.25 mg PO BID - Continue anticoagulation with Eliquis - continue statin LDL < 55 he is at goal - CAC score 863 - we have reviewed symptoms of CAD and AS that would cause [place] to be more aggressive in testing (CCTA or LHC)  Echo in 11.5 months Me in one year  Medication Adjustments/Labs and Tests Ordered: Current medicines are reviewed at length with the patient today.  Concerns regarding medicines are outlined above.  Orders Placed This Encounter  Procedures   EKG 12-Lead   ECHOCARDIOGRAM COMPLETE   No orders of the defined types were placed in this encounter.   Patient Instructions  Medication Instructions:  Your physician recommends that you continue on your current medications as directed. Please refer to the Current Medication list given to you today.  *If you need a refill on your cardiac medications before your next appointment, please call your pharmacy*   Lab Work: NONE If you have labs (blood work) drawn today and your tests are completely normal, you will receive your results only by: MyChart Message (if you have MyChart) OR A paper copy in the mail If you have any lab test that is abnormal or we need to change your treatment, we will call you to review the results.   Testing/Procedures: Your physician has requested that you have an echocardiogram in 11 months. Echocardiography is a painless test that uses sound waves to create images of your heart. It provides your doctor with information about the size and shape of your heart and how well your hearts chambers and valves are working. This procedure takes  approximately one hour. There are no restrictions for this procedure.    Follow-Up: At [facility], you and your health needs are our priority.  As part of our continuing mission to provide you with exceptional heart care, we have created designated Provider Care Teams.  These Care Teams include your primary Cardiologist (physician) and Advanced Practice Providers (APPs -  Physician Assistants and Nurse Practitioners) who all work together to provide you with the care you need, when you need it.   Your next appointment:   12 month(s)  The format for your next appointment:   In Person  Provider:   Christell Constant, MD      Signed, Christell Constant, MD  04/03/2021 10:06 AM    [facility]

## 2021-04-03 ENCOUNTER — Other Ambulatory Visit: Payer: Self-pay

## 2021-04-03 ENCOUNTER — Ambulatory Visit: Payer: Medicare PPO | Admitting: Internal Medicine

## 2021-04-03 ENCOUNTER — Encounter: Payer: Self-pay | Admitting: Internal Medicine

## 2021-04-03 VITALS — BP 140/80 | HR 66 | Ht 72.0 in | Wt 254.0 lb

## 2021-04-03 DIAGNOSIS — I350 Nonrheumatic aortic (valve) stenosis: Secondary | ICD-10-CM

## 2021-04-03 DIAGNOSIS — E785 Hyperlipidemia, unspecified: Secondary | ICD-10-CM

## 2021-04-03 DIAGNOSIS — I10 Essential (primary) hypertension: Secondary | ICD-10-CM | POA: Diagnosis not present

## 2021-04-03 DIAGNOSIS — E119 Type 2 diabetes mellitus without complications: Secondary | ICD-10-CM

## 2021-04-03 DIAGNOSIS — I484 Atypical atrial flutter: Secondary | ICD-10-CM | POA: Diagnosis not present

## 2021-04-03 NOTE — Patient Instructions (Signed)
Medication Instructions:  ?Your physician recommends that you continue on your current medications as directed. Please refer to the Current Medication list given to you today. ? ?*If you need a refill on your cardiac medications before your next appointment, please call your pharmacy* ? ? ?Lab Work: ?NONE ?If you have labs (blood work) drawn today and your tests are completely normal, you will receive your results only by: ?MyChart Message (if you have MyChart) OR ?A paper copy in the mail ?If you have any lab test that is abnormal or we need to change your treatment, we will call you to review the results. ? ? ?Testing/Procedures: ?Your physician has requested that you have an echocardiogram in 11 months. Echocardiography is a painless test that uses sound waves to create images of your heart. It provides your doctor with information about the size and shape of your heart and how well your heart?s chambers and valves are working. This procedure takes approximately one hour. There are no restrictions for this procedure. ? ? ? ?Follow-Up: ?At [facility], you and your health needs are our priority.  As part of our continuing mission to provide you with exceptional heart care, we have created designated Provider Care Teams.  These Care Teams include your primary Cardiologist (physician) and Advanced Practice Providers (APPs -  Physician Assistants and Nurse Practitioners) who all work together to provide you with the care you need, when you need it. ? ? ?Your next appointment:   ?12 month(s) ? ?The format for your next appointment:   ?In Person ? ?Provider:   ?Christell Constant, MD    ?

## 2021-05-22 ENCOUNTER — Other Ambulatory Visit: Payer: Self-pay | Admitting: Internal Medicine

## 2021-08-16 ENCOUNTER — Other Ambulatory Visit: Payer: Self-pay | Admitting: Internal Medicine

## 2021-08-17 ENCOUNTER — Other Ambulatory Visit: Payer: Self-pay

## 2021-08-17 MED ORDER — CARVEDILOL 6.25 MG PO TABS: 6.2500 mg | ORAL_TABLET | Freq: Two times a day (BID) | ORAL | 3 refills | Status: DC

## 2021-08-17 NOTE — Telephone Encounter (Signed)
Prescription refill request for Eliquis received. Indication:Aflutter Last office visit:3/10 Scr:0.9 Age: 81 Weight:115.2 kg  Prescription refilled

## 2021-10-23 DIAGNOSIS — I4891 Unspecified atrial fibrillation: Secondary | ICD-10-CM | POA: Diagnosis not present

## 2021-10-23 DIAGNOSIS — E7800 Pure hypercholesterolemia, unspecified: Secondary | ICD-10-CM | POA: Diagnosis not present

## 2021-10-23 DIAGNOSIS — D6869 Other thrombophilia: Secondary | ICD-10-CM | POA: Diagnosis not present

## 2021-10-23 DIAGNOSIS — Z0000 Encounter for general adult medical examination without abnormal findings: Secondary | ICD-10-CM | POA: Diagnosis not present

## 2021-10-23 DIAGNOSIS — I10 Essential (primary) hypertension: Secondary | ICD-10-CM | POA: Diagnosis not present

## 2021-10-23 DIAGNOSIS — E1169 Type 2 diabetes mellitus with other specified complication: Secondary | ICD-10-CM | POA: Diagnosis not present

## 2021-10-24 DIAGNOSIS — Z23 Encounter for immunization: Secondary | ICD-10-CM | POA: Diagnosis not present

## 2022-02-08 ENCOUNTER — Other Ambulatory Visit: Payer: Self-pay | Admitting: Internal Medicine

## 2022-02-08 NOTE — Telephone Encounter (Signed)
Prescription refill request for Eliquis received. Indication:aflutter Last office visit:3/23 Scr:0.9 Age: 82 Weight:115.2  kg  Prescription refilled

## 2022-02-09 DIAGNOSIS — H40013 Open angle with borderline findings, low risk, bilateral: Secondary | ICD-10-CM | POA: Diagnosis not present

## 2022-03-01 ENCOUNTER — Ambulatory Visit (HOSPITAL_COMMUNITY): Payer: Medicare PPO | Attending: Cardiology

## 2022-03-01 DIAGNOSIS — I350 Nonrheumatic aortic (valve) stenosis: Secondary | ICD-10-CM | POA: Insufficient documentation

## 2022-03-01 LAB — ECHOCARDIOGRAM COMPLETE
AR max vel: 1.34 cm2
AV Area VTI: 3.39 cm2
AV Area mean vel: 3.54 cm2
AV Mean grad: 1 mmHg
AV Peak grad: 16 mmHg
Ao pk vel: 2 m/s
Area-P 1/2: 3.59 cm2
S' Lateral: 3.1 cm

## 2022-03-05 ENCOUNTER — Other Ambulatory Visit (HOSPITAL_COMMUNITY): Payer: Medicare PPO

## 2022-03-12 IMAGING — CT CT CARDIAC CORONARY ARTERY CALCIUM SCORE
3 series · 14 of 20 positions shown, 16 images · non-contrast
Comparison: None.
COMPARISON: None.

Addendum:
:
The following report is an over-read performed by radiologist Dr.
Margarito Coyle [REDACTED] on March 27, 2021. This
over-read does not include interpretation of cardiac or coronary
anatomy or pathology. The coronary calcium score interpretation by
the cardiologist is attached.
CLINICAL DATA: 80M for cardiovascular disease risk stratification

EXAM:
Coronary Calcium Score
TECHNIQUE: A gated, non-contrast computed tomography scan of the heart was
performed using 3mm slice thickness. Axial images were analyzed on a
dedicated workstation. Calcium scoring of the coronary arteries was
performed using the Agatston method.

[Series 2: cascseq 2.0 sa36 70% (id) · axial · 0.46mm/px · z∈[-289,-187]mm · 4 of 85 slices shown]
[im 17/85  vessel]
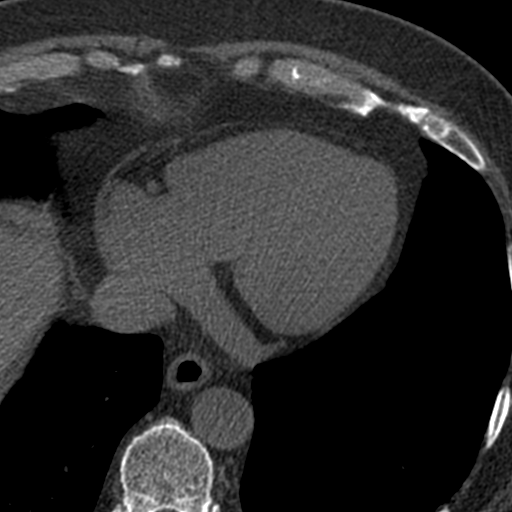
[im 34/85  vessel]
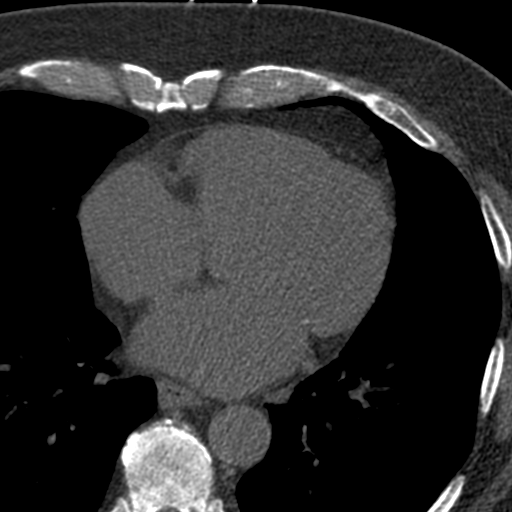
[im 51/85  vessel]
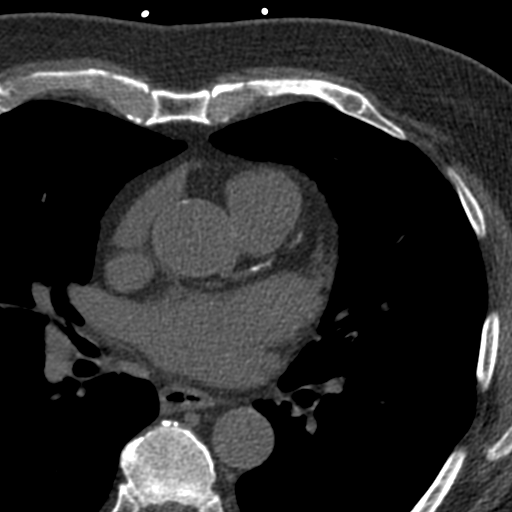
[im 68/85  vessel]
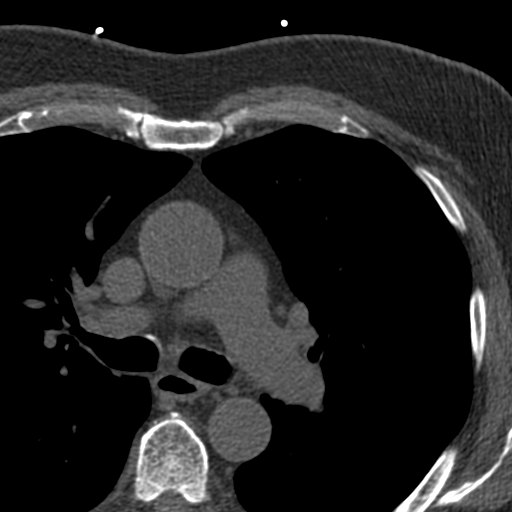

[Series 3: cascseq 2.0 bf37 st · axial · 0.77mm/px · z∈[-293,-181]mm · 5 of 85 slices shown, 7 images]
[im 15/85  vessel]
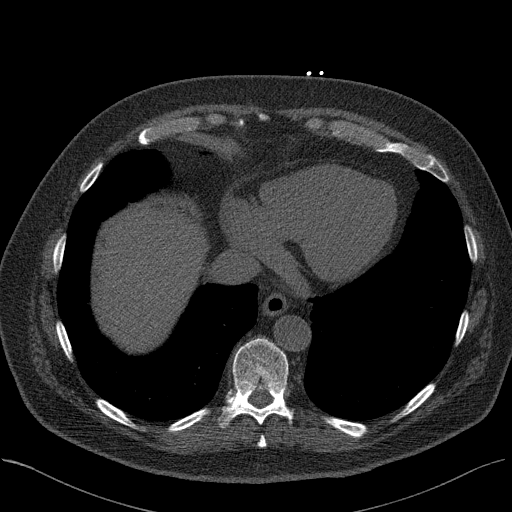
[im 15/85  lung]
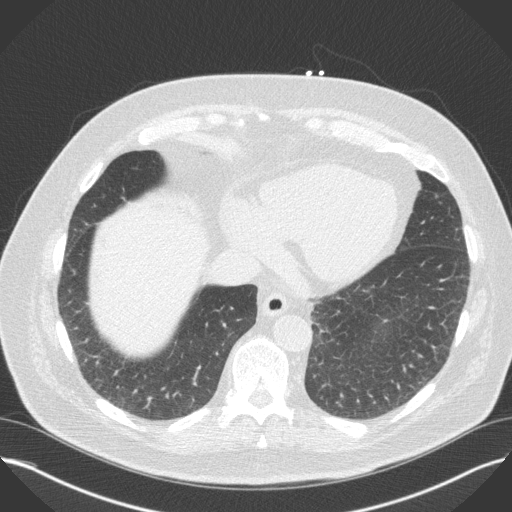
[im 29/85  vessel]
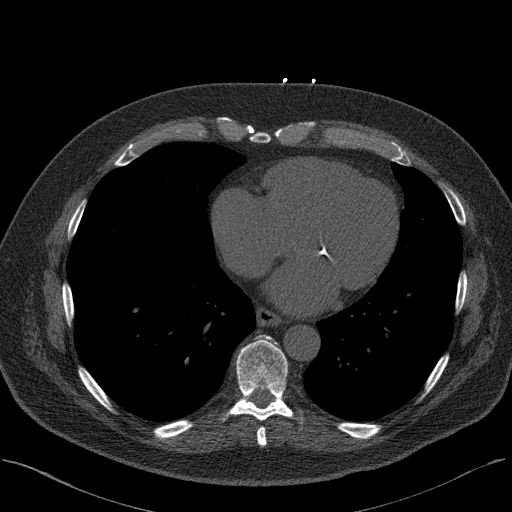
[im 43/85  vessel]
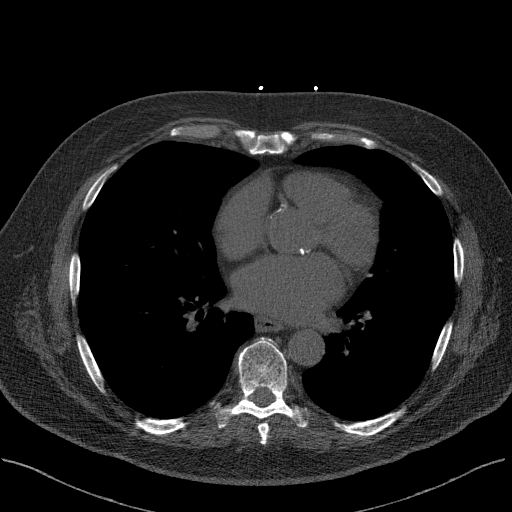
[im 57/85  vessel]
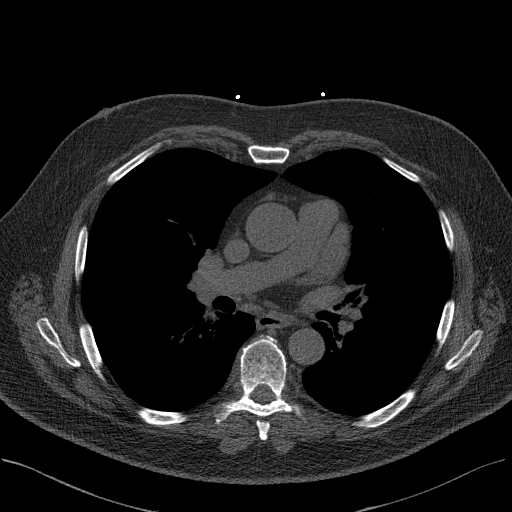
[im 71/85  vessel]
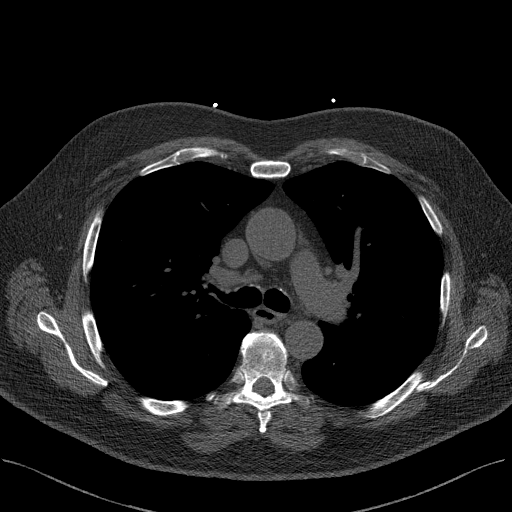
[im 71/85  lung]
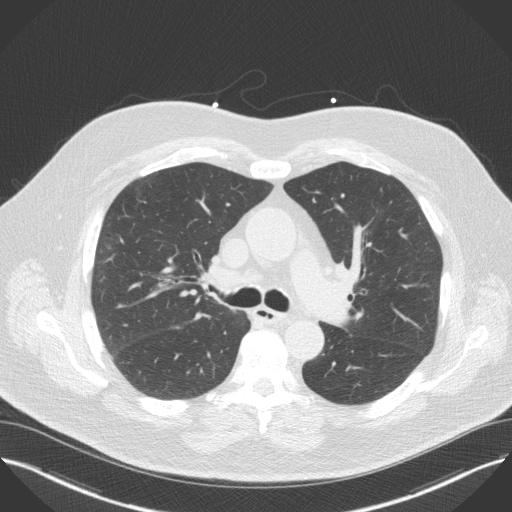

[Series 4: cascseq 2.0 br59 lung · axial · 0.77mm/px · z∈[-295,-181]mm · 5 of 84 slices shown]
[im 14/84  lung]
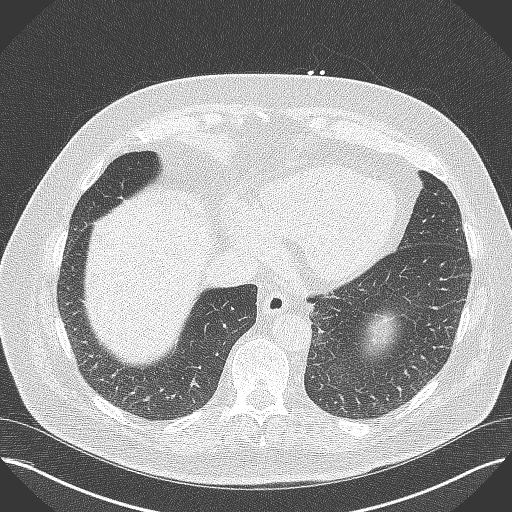
[im 28/84  lung]
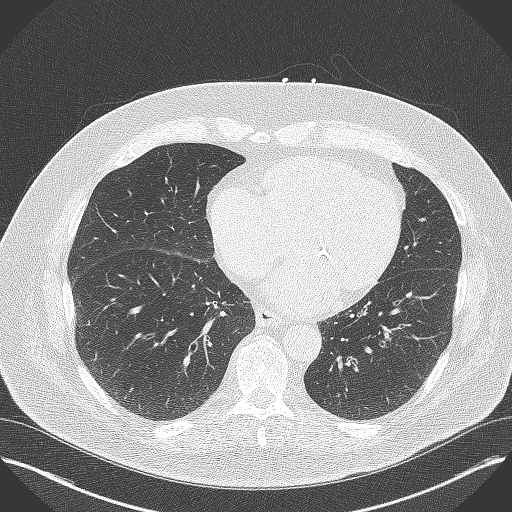
[im 42/84  lung]
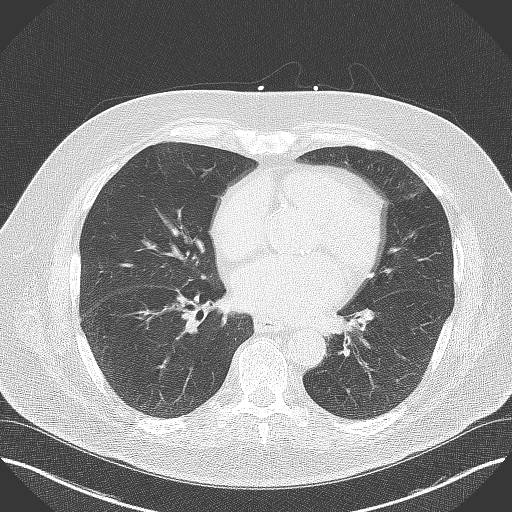
[im 56/84  lung]
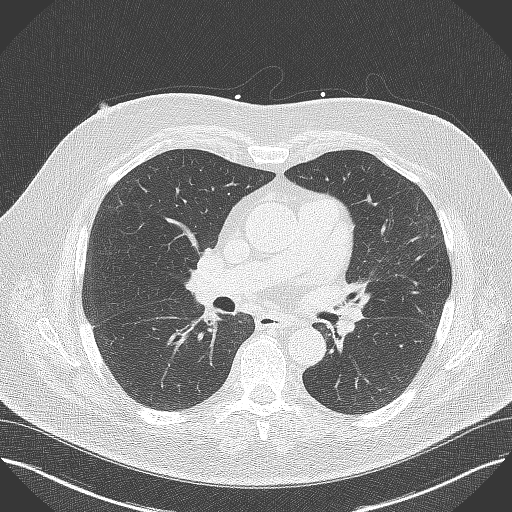
[im 70/84  lung]
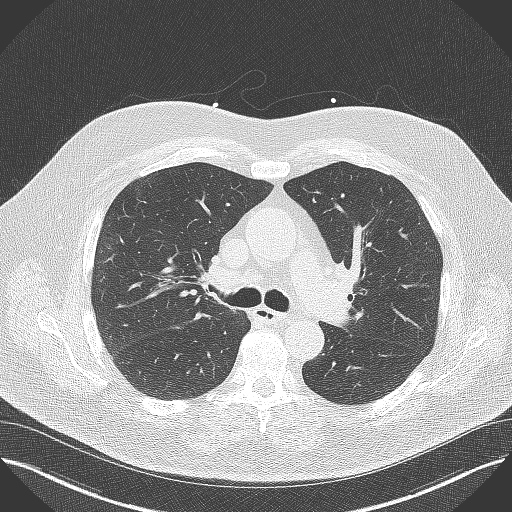

[14 of 20 positions shown; findings below may reference images not displayed]

FINDINGS: Vascular: Coronary artery calcifications. Calcifications aortic
valve. Aortic atherosclerosis without aneurysmal dilation.

Mediastinum/Nodes: No pathologically enlarged mediastinal, hilar or
axillary lymph nodes, noting limited sensitivity for the detection
of hilar adenopathy on this noncontrast study. Esophagus is
unremarkable.

Lungs/Pleura: Diffuse bronchial wall thickening and slight mosaic
attenuation of the lungs. No suspicious pulmonary nodules or masses.
Mild lower lobe predominant subpleural reticulations.

Upper Abdomen: No acute abnormality.

Musculoskeletal: No acute osseous abnormality.
IMPRESSION: 1. Diffuse bronchial wall thickening with slight mosaic attenuation
of the lungs, possibly reflecting small airways disease.
2. Mild lower lobe predominant subpleural reticulations, which may
reflect mild interstitial lung disease.
3. Aortic Atherosclerosis (XNB1I-RPD.D).
FINDINGS: Coronary arteries: Normal origins.

Coronary Calcium Score:

Left main:

Left anterior descending artery: 540

Left circumflex artery: 153

Right coronary artery: 314

Total: 6959

Percentile: 73rd

Pericardium: Normal.

Ascending Aorta: Mildly dilated.  3.9 cm.  Aortic atherosclerosis.

Mild mitral annular calcification.

Non-cardiac: See separate report from [REDACTED].
IMPRESSION: Coronary calcium score of 6959. This was 73rd percentile for age-,
race-, and sex-matched controls.

Mitral annular calcification.

Ascending aorta mildly dilated.  3.9 cm.  Aortic atherosclerosis.



If CAC=0, it is reasonable to withhold statin therapy and reassess
in 5 to 10 years, as long as higher risk conditions are absent
(diabetes mellitus, family history of premature CHD in first degree
relatives (males <55 years; females <65 years), cigarette smoking,
or LDL >=190 mg/dL).

If CAC is 1 to 99, it is reasonable to initiate statin therapy for
patients >=55 years of age.

If CAC is >=100 or >=75th percentile, it is reasonable to initiate
statin therapy at any age.

Cardiology referral should be considered for patients with CAC
scores >=400 or >=75th percentile.

*6327 AHA/ACC/AACVPR/AAPA/ABC/LECHNIAK/[place]/POLYCARP/[place]le[place]ALICO/INHA INTI/OBOSITSWE
Guideline on the Management of Blood Cholesterol: A Report of the
American College of Cardiology/American Heart Association Task Force
on Clinical Practice Guidelines. J Am Coll Cardiol.
2410;73(24):0687-0091.

*** End of Addendum ***
:
The following report is an over-read performed by radiologist Dr.
Margarito Coyle [REDACTED] on March 27, 2021. This
over-read does not include interpretation of cardiac or coronary
anatomy or pathology. The coronary calcium score interpretation by
the cardiologist is attached.
FINDINGS: Vascular: Coronary artery calcifications. Calcifications aortic
valve. Aortic atherosclerosis without aneurysmal dilation.

Mediastinum/Nodes: No pathologically enlarged mediastinal, hilar or
axillary lymph nodes, noting limited sensitivity for the detection
of hilar adenopathy on this noncontrast study. Esophagus is
unremarkable.

Lungs/Pleura: Diffuse bronchial wall thickening and slight mosaic
attenuation of the lungs. No suspicious pulmonary nodules or masses.
Mild lower lobe predominant subpleural reticulations.

Upper Abdomen: No acute abnormality.

Musculoskeletal: No acute osseous abnormality.
IMPRESSION: 1. Diffuse bronchial wall thickening with slight mosaic attenuation
of the lungs, possibly reflecting small airways disease.
2. Mild lower lobe predominant subpleural reticulations, which may
reflect mild interstitial lung disease.
3. Aortic Atherosclerosis (XNB1I-RPD.D).

## 2022-04-11 DIAGNOSIS — J208 Acute bronchitis due to other specified organisms: Secondary | ICD-10-CM | POA: Diagnosis not present

## 2022-05-16 DIAGNOSIS — R051 Acute cough: Secondary | ICD-10-CM | POA: Diagnosis not present

## 2022-05-18 ENCOUNTER — Other Ambulatory Visit: Payer: Self-pay | Admitting: Internal Medicine

## 2022-08-04 ENCOUNTER — Other Ambulatory Visit: Payer: Self-pay | Admitting: Internal Medicine

## 2022-08-06 ENCOUNTER — Other Ambulatory Visit: Payer: Self-pay | Admitting: Internal Medicine

## 2022-08-06 NOTE — Telephone Encounter (Signed)
Prescription refill request for Eliquis received. Indication:aflutter Last office visit:upcoming Scr:0.9  9/23 Age: 82 Weight:115.2  kg  Prescription refilled

## 2022-08-15 ENCOUNTER — Other Ambulatory Visit: Payer: Self-pay | Admitting: Internal Medicine

## 2022-10-27 ENCOUNTER — Ambulatory Visit: Payer: Medicare PPO | Attending: Internal Medicine | Admitting: Internal Medicine

## 2022-10-27 ENCOUNTER — Encounter: Payer: Self-pay | Admitting: Internal Medicine

## 2022-10-27 VITALS — BP 150/82 | HR 67 | Ht 72.0 in | Wt 258.0 lb

## 2022-10-27 DIAGNOSIS — I484 Atypical atrial flutter: Secondary | ICD-10-CM

## 2022-10-27 DIAGNOSIS — I350 Nonrheumatic aortic (valve) stenosis: Secondary | ICD-10-CM

## 2022-10-27 MED ORDER — AMLODIPINE BESYLATE 2.5 MG PO TABS: 2.5000 mg | ORAL_TABLET | Freq: Every day | ORAL | 3 refills | Status: DC

## 2022-10-27 NOTE — Progress Notes (Signed)
Cardiology Office Note:    Date:  10/27/2022   ID:  Arthur Liu, DOB 12-10-1940, MRN [id number]  PCP:  Catha Gosselin, MD  [facility] Cardiologist:  Riley Lam MD [facility] Electrophysiologist:  None   CC: Follow up AFL, and Moderate AS  History of Present Illness:    Arthur Liu is a 82 y.o. male with a hx of Diabetes and HLD, Persistent AFl, and Asthma who presents for evaluation 01/02/20.    2022: had elevated gradients. CAC score done showing CAD and AV Calcium. 2023: asymptomatic BP well controlled.  Discussed the use of AI scribe software for clinical note transcription with the patient, who gave verbal consent to proceed.  History of Present Illness         Arthur Liu, an 82 year old male with a history of hyperlipidemia, diabetes, and persistent atrial flutter, presents for a routine follow-up. He was first seen in 2021 when he was found to have elevated aortic valve gradients with coronary artery calcium and aortic valve calcium. 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  In February of this year, an echocardiogram showed a nominal aortic valve mean gradient and a decrease in his stroke volume index, suggesting a component of low flow, low gradient aortic stenosis. However, his mean gradients have been fairly low, and he has remained asymptomatic. He has been imaged yearly to monitor for progression of this condition.  He has a history of both conditions. His hemoglobin at his annual visit has been 14.5, and his LDL is well controlled at 57 on Crestor 20.  Arthur Liu reports that his blood pressure has been consistently around 150-152 for the past three years. He does not regularly check his blood pressure at home but notes that readings from a wrist cuff he purchased have always been higher.  Still doing the recumbent bike.  His wife has her cuff.   Past Medical History:   Diagnosis Date   Allergic rhinitis    Asthma    Atrial fibrillation (HCC)    Body mass index (BMI) 32.0-32.9, adult    Diabetes mellitus without complication (HCC)    DM (diabetes mellitus) (HCC)    Elevated blood pressure reading    GERD (gastroesophageal reflux disease)    High cholesterol    Hypercholesterolemia    Hyperlipidemia    Irregular heart rhythm    Microalbuminuria    Nasal polyposis    Obesity    Plantar wart    Small bowel obstruction (HCC)     Past Surgical History:  Procedure Laterality Date   ABDOMINAL EXPLORATION SURGERY  2010   found blockage on cxr---surgery showed nothing   CYST EXCISION  1966    Current Medications: Current Meds  Medication Sig   acetaminophen (TYLENOL) 500 MG tablet Take 1,000 mg by mouth every 6 (six) hours as needed for mild pain or headache.   albuterol (PROVENTIL HFA;VENTOLIN HFA) 108 (90 BASE) MCG/ACT inhaler Inhale 2 puffs into the lungs every 6 (six) hours as needed for wheezing.   amLODipine (NORVASC) 2.5 MG tablet Take 1 tablet (2.5 mg total) by mouth daily.   carvedilol (COREG) 6.25 MG tablet TAKE 1 TABLET BY MOUTH TWICE A DAY   ELIQUIS 5 MG TABS tablet TAKE 1 TABLET BY MOUTH TWICE A DAY   rosuvastatin (CRESTOR) 20 MG tablet TAKE 1 TABLET BY MOUTH EVERY DAY    Allergies:   Patient has no known  allergies.   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  Tobacco Use   Smoking status: Never   Smokeless tobacco: Never  Substance and Sexual Activity   Alcohol use: Yes    Comment: occassional//rare-about 2 glasses of white wine a month   Drug use: No   Sexual activity: Never  Other Topics Concern   Not on file  Social History Narrative   Not on file   Social Determinants of Health    Financial Resource Strain: Not on file  Food Insecurity: Not on file  Transportation Needs: Not on file  Physical Activity: Not on file  Stress: Not on file  Social Connections: Not on file    Social: Former Economist of a [employer], received a lifetime achievement/hall of fame award for [employer] in [place] Family History: The patient's family history includes Heart attack in his father and mother; Heart disease in his brother; Prostate cancer in his brother. No hx of atrial fibrillation or atrial flutter  ROS:   Please see the history of present illness.      EKGs/Labs/Other Studies Reviewed:    The following studies were reviewed today:  Cardiac Studies & Procedures       ECHOCARDIOGRAM  ECHOCARDIOGRAM COMPLETE 03/01/2022  Narrative ECHOCARDIOGRAM REPORT    Patient Name:   Arthur Liu Date of Exam: 03/01/2022 Medical Rec #:  [id number]      Height:       72.0 in Accession #:    [id number]     Weight:       254.0 lb Date of Birth:  [birth year]      BSA:          2.358 m Patient Age:    81 years       BP:           173/107 mmHg Patient Gender: M              HR:           71 bpm. Exam Location:  [address]  Procedure: 2D Echo, Cardiac Doppler and Color Doppler  Indications:    I35.0 Aortic Stenosis  History:        Patient has prior history of Echocardiogram examinations, most recent 03/11/2021. Arrythmias:Atrial Flutter; Risk Factors:Diabetes and HLD.  Sonographer:    Clearence Ped RCS Referring Phys: [id number] Fionn Stracke A Kyian Obst  IMPRESSIONS   1. Left ventricular ejection fraction, by estimation, is 65 to 70%. The left ventricle has normal function. The left ventricle has no regional wall motion abnormalities. Left ventricular diastolic parameters are indeterminate. 2. Right ventricular systolic function is normal. The right ventricular size is normal. There is normal pulmonary artery systolic pressure. 3. Left atrial size was mildly  dilated. 4. Right atrial size was mildly dilated. 5. The mitral valve is normal in structure. Mild mitral valve regurgitation. No evidence of mitral stenosis. 6. The aortic valve is tricuspid. There is moderate calcification of the aortic valve. There is moderate thickening of the aortic valve. Aortic valve regurgitation is not visualized. Aortic valve sclerosis is present, with no evidence of aortic valve stenosis. Aortic valve Vmax measures 2.00 m/s. 7. The inferior vena cava is normal in size with greater than 50% respiratory  variability, suggesting right atrial pressure of 3 mmHg.  FINDINGS Left Ventricle: Left ventricular ejection fraction, by estimation, is 65 to 70%. The left ventricle has normal function. The left ventricle has no regional wall motion abnormalities. The left ventricular internal cavity size was normal in size. There is no left ventricular hypertrophy. Left ventricular diastolic parameters are indeterminate.  Right Ventricle: The right ventricular size is normal. No increase in right ventricular wall thickness. Right ventricular systolic function is normal. There is normal pulmonary artery systolic pressure. The tricuspid regurgitant velocity is 1.38 m/s, and with an assumed right atrial pressure of 3 mmHg, the estimated right ventricular systolic pressure is 10.6 mmHg.  Left Atrium: Left atrial size was mildly dilated.  Right Atrium: Right atrial size was mildly dilated.  Pericardium: There is no evidence of pericardial effusion.  Mitral Valve: The mitral valve is normal in structure. Mild mitral valve regurgitation. No evidence of mitral valve stenosis.  Tricuspid Valve: The tricuspid valve is normal in structure. Tricuspid valve regurgitation is mild . No evidence of tricuspid stenosis.  Aortic Valve: The aortic valve is tricuspid. There is moderate calcification of the aortic valve. There is moderate thickening of the aortic valve. Aortic valve regurgitation is not  visualized. Aortic valve sclerosis is present, with no evidence of aortic valve stenosis. Aortic valve mean gradient measures 1.0 mmHg. Aortic valve peak gradient measures 16.0 mmHg. Aortic valve area, by VTI measures 3.39 cm.  Pulmonic Valve: The pulmonic valve was normal in structure. Pulmonic valve regurgitation is mild. No evidence of pulmonic stenosis.  Aorta: The aortic root is normal in size and structure.  Venous: The inferior vena cava is normal in size with greater than 50% respiratory variability, suggesting right atrial pressure of 3 mmHg.  IAS/Shunts: No atrial level shunt detected by color flow Doppler.   LEFT VENTRICLE PLAX 2D LVIDd:         4.60 cm   Diastology LVIDs:         3.10 cm   LV e' medial:    8.27 cm/s LV PW:         0.90 cm   LV E/e' medial:  12.5 LV IVS:        0.70 cm   LV e' lateral:   12.50 cm/s LVOT diam:     2.10 cm   LV E/e' lateral: 8.2 LV SV:         59 LV SV Index:   25 LVOT Area:     3.46 cm   RIGHT VENTRICLE RV Basal diam:  3.50 cm RV S prime:     11.10 cm/s TAPSE (M-mode): 2.0 cm RVSP:           10.6 mmHg  LEFT ATRIUM             Index        RIGHT ATRIUM           Index LA diam:        5.30 cm 2.25 cm/m   RA Pressure: 3.00 mmHg LA Vol (A2C):   80.1 ml 33.96 ml/m  RA Area:     19.50 cm LA Vol (A4C):   83.3 ml 35.32 ml/m  RA Volume:   49.70 ml  21.07 ml/m LA Biplane Vol: 86.8 ml 36.81 ml/m AORTIC VALVE AV Area (Vmax):    1.34 cm AV Area (Vmean):   3.54 cm AV Area (VTI):     3.39 cm AV Vmax:  200.00 cm/s AV Vmean:          50.800 cm/s AV VTI:            0.175 m AV Peak Grad:      16.0 mmHg AV Mean Grad:      1.0 mmHg LVOT Vmax:         77.55 cm/s LVOT Vmean:        51.900 cm/s LVOT VTI:          0.172 m LVOT/AV VTI ratio: 0.98  AORTA Ao Root diam: 3.50 cm Ao Asc diam:  3.60 cm  MITRAL VALVE                TRICUSPID VALVE MV Area (PHT):              TR Peak grad:   7.6 mmHg MV Decel Time:              TR  Vmax:        138.00 cm/s MV E velocity: 103.00 cm/s  Estimated RAP:  3.00 mmHg RVSP:           10.6 mmHg  SHUNTS Systemic VTI:  0.17 m Systemic Diam: 2.10 cm  Donato Schultz MD Electronically signed by Donato Schultz MD Signature Date/Time: 03/01/2022/3:09:23 PM    Final     CT SCANS  CT CARDIAC SCORING (SELF PAY ONLY) 03/27/2021  Addendum 03/27/2021  1:00 PM ADDENDUM REPORT: 03/27/2021 12:57  CLINICAL DATA:  69M for cardiovascular disease risk stratification  EXAM: Coronary Calcium Score  TECHNIQUE: A gated, non-contrast computed tomography scan of the heart was performed using 3mm slice thickness. Axial images were analyzed on a dedicated workstation. Calcium scoring of the coronary arteries was performed using the Agatston method.  FINDINGS: Coronary arteries: Normal origins.  Coronary Calcium Score:  Left main: 52.8  Left anterior descending artery: 540  Left circumflex artery: 153  Right coronary artery: 314  Total: 1060  Percentile: 73rd  Pericardium: Normal.  Ascending Aorta: Mildly dilated.  3.9 cm.  Aortic atherosclerosis.  Mild mitral annular calcification.  Non-cardiac: See separate report from [facility].  IMPRESSION: Coronary calcium score of 1060. This was 73rd percentile for age-, race-, and sex-matched controls.  Mitral annular calcification.  Ascending aorta mildly dilated.  3.9 cm.  Aortic atherosclerosis.  RECOMMENDATIONS: Coronary artery calcium (CAC) score is a strong predictor of incident coronary heart disease (CHD) and provides predictive information beyond traditional risk factors. CAC scoring is reasonable to use in the decision to withhold, postpone, or initiate statin therapy in intermediate-risk or selected borderline-risk asymptomatic adults (age 38-75 years and LDL-C >=70 to <190 mg/dL) who do not have diabetes or established atherosclerotic cardiovascular disease (ASCVD).* In intermediate-risk (10-year  ASCVD risk >=7.5% to <20%) adults or selected borderline-risk (10-year ASCVD risk >=5% to <7.5%) adults in whom a CAC score is measured for the purpose of making a treatment decision the following recommendations have been made:  If CAC=0, it is reasonable to withhold statin therapy and reassess in 5 to 10 years, as long as higher risk conditions are absent (diabetes mellitus, family history of premature CHD in first degree relatives (males <55 years; females <65 years), cigarette smoking, or LDL >=190 mg/dL).  If CAC is 1 to 99, it is reasonable to initiate statin therapy for patients >=67 years of age.  If CAC is >=100 or >=75th percentile, it is reasonable to initiate statin therapy at any age.  Cardiology referral should be considered for  patients with CAC scores >=400 or >=75th percentile.  *2018 AHA/ACC/AACVPR/AAPA/ABC/ACPM/ADA/AGS/APhA/ASPC/NLA/PCNA Guideline on the Management of Blood Cholesterol: A Report of the American College of Cardiology/American Heart Association Task Force on Clinical Practice Guidelines. J Am Coll Cardiol. 2019;73(24):3168-3209.  Chilton Si, MD   Electronically Signed By: Chilton Si M.D. On: 03/27/2021 12:57  Narrative : The following report is an over-read performed by radiologist Dr. Maudry Mayhew of [facility] on March 27, 2021. This over-read does not include interpretation of cardiac or coronary anatomy or pathology. The coronary calcium score interpretation by the cardiologist is attached.  COMPARISON:  None.  FINDINGS: Vascular: Coronary artery calcifications. Calcifications aortic valve. Aortic atherosclerosis without aneurysmal dilation.  Mediastinum/Nodes: No pathologically enlarged mediastinal, hilar or axillary lymph nodes, noting limited sensitivity for the detection of hilar adenopathy on this noncontrast study. Esophagus is unremarkable.  Lungs/Pleura: Diffuse bronchial wall thickening and  slight mosaic attenuation of the lungs. No suspicious pulmonary nodules or masses. Mild lower lobe predominant subpleural reticulations.  Upper Abdomen: No acute abnormality.  Musculoskeletal: No acute osseous abnormality.  IMPRESSION: 1. Diffuse bronchial wall thickening with slight mosaic attenuation of the lungs, possibly reflecting small airways disease. 2. Mild lower lobe predominant subpleural reticulations, which may reflect mild interstitial lung disease. 3. Aortic Atherosclerosis (ICD10-I70.0).  Electronically Signed: By: Maudry Mayhew M.D. On: 03/27/2021 09:38           Recent Labs: No results found for requested labs within last 365 days.  Recent Lipid Panel    Component Value Date/Time   CHOL 104 10/19/2010 0645   TRIG 53 10/19/2010 0645   HDL 37 (L) 10/19/2010 0645   CHOLHDL 2.8 10/19/2010 0645   VLDL 11 10/19/2010 0645   LDLCALC 56 10/19/2010 0645     Physical Exam:    VS:  BP (!) 150/82   Pulse 67   Ht 6' (1.829 m)   Wt 258 lb (117 kg)   SpO2 97%   BMI 34.99 kg/m     Wt Readings from Last 3 Encounters:  10/27/22 258 lb (117 kg)  04/03/21 254 lb (115.2 kg)  04/02/20 245 lb (111.1 kg)    Gen: no distress  Neck: No JVD Cardiac: No Rubs or Gallops, systolic murmur, RRR Respiratory: Clear to auscultation bilaterally, normal effort, normal  respiratory rate GI: Soft, nontender, non-distended  MS: No edema;  moves all extremities Integument: Skin feels warm Neuro:  At time of evaluation, alert and oriented to person/place/time/situation  Psych: Normal affect, patient feels    ASSESSMENT:    1. Nonrheumatic aortic (valve) stenosis   2. Atypical atrial flutter (HCC)   3. Severe aortic stenosis      PLAN:    Longstanding Persistent Atrial Fibrillation/Flutter - CHASDVASC 4 Stable on Eliquis 5mg  BID. EKG today more consistent with atrial fibrillation than atrial flutter. -Continue Eliquis 5mg  BID. - normal function and asymptomatic; we  review the risks and benefits of rhythm or rate control - hgb stable with PCP  Hypertension - Blood pressure elevated today compared to previous visits. Patient does not regularly monitor blood pressure at home. Discussed starting a low dose antihypertensive medication. -Start Amlodipine 2.5mg  daily. -Encourage patient to monitor blood pressure at home and report readings via MyChart. -Plan for nurse visit to assess cuff accuracy if blood pressure remains high on medication. - will increase recumbent biking  Aortic Stenosis - Mild to moderate low flow low gradient aortic stenosis with calcium score of 863. Asymptomatic with stable echocardiogram findings. -  Repeat echocardiogram in February 2025 to monitor progression. -Continue current management and monitor for symptoms.  Hyperlipidemia - LDL well controlled on current medication (Crestor 20mg ). -Continue Crestor 20mg .  Follow-up Plan to see patient in 1 year unless symptoms or concerns arise.   Medication Adjustments/Labs and Tests Ordered: Current medicines are reviewed at length with the patient today.  Concerns regarding medicines are outlined above.  Orders Placed This Encounter  Procedures   EKG 12-Lead   ECHOCARDIOGRAM COMPLETE   Meds ordered this encounter  Medications   amLODipine (NORVASC) 2.5 MG tablet    Sig: Take 1 tablet (2.5 mg total) by mouth daily.    Dispense:  90 tablet    Refill:  3    Patient Instructions  Medication Instructions:  Your physician has recommended you make the following change in your medication:   START: amlodipine (Norvasc) 2.5 mg by mouth once daily.  Please monitor your BP as discussed with Dr. Izora Ribas.  *If you need a refill on your cardiac medications before your next appointment, please call your pharmacy*   Lab Work: NONE If you have labs (blood work) drawn today and your tests are completely normal, you will receive your results only by: MyChart Message (if you have  MyChart) OR A paper copy in the mail If you have any lab test that is abnormal or we need to change your treatment, we will call you to review the results.   Testing/Procedures: FEB. 2025- - Your physician has requested that you have an echocardiogram. Echocardiography is a painless test that uses sound waves to create images of your heart. It provides your doctor with information about the size and shape of your heart and how well your heart's chambers and valves are working. This procedure takes approximately one hour. There are no restrictions for this procedure. Please do NOT wear cologne, perfume, aftershave, or lotions (deodorant is allowed). Please arrive 15 minutes prior to your appointment time.   Follow-Up: At [facility], you and your health needs are our priority.  As part of our continuing mission to provide you with exceptional heart care, we have created designated Provider Care Teams.  These Care Teams include your primary Cardiologist (physician) and Advanced Practice Providers (APPs -  Physician Assistants and Nurse Practitioners) who all work together to provide you with the care you need, when you need it.   Your next appointment:   1 year(s)  Provider:   Christell Constant, MD        Signed, Christell Constant, MD  10/27/2022 1:06 PM    [facility]

## 2022-10-27 NOTE — Patient Instructions (Signed)
Medication Instructions:  Your physician has recommended you make the following change in your medication:   START: amlodipine (Norvasc) 2.5 mg by mouth once daily.  Please monitor your BP as discussed with Dr. Izora Ribas.  *If you need a refill on your cardiac medications before your next appointment, please call your pharmacy*   Lab Work: NONE If you have labs (blood work) drawn today and your tests are completely normal, you will receive your results only by: MyChart Message (if you have MyChart) OR A paper copy in the mail If you have any lab test that is abnormal or we need to change your treatment, we will call you to review the results.   Testing/Procedures: FEB. 2025- - Your physician has requested that you have an echocardiogram. Echocardiography is a painless test that uses sound waves to create images of your heart. It provides your doctor with information about the size and shape of your heart and how well your heart's chambers and valves are working. This procedure takes approximately one hour. There are no restrictions for this procedure. Please do NOT wear cologne, perfume, aftershave, or lotions (deodorant is allowed). Please arrive 15 minutes prior to your appointment time.   Follow-Up: At [facility], you and your health needs are our priority.  As part of our continuing mission to provide you with exceptional heart care, we have created designated Provider Care Teams.  These Care Teams include your primary Cardiologist (physician) and Advanced Practice Providers (APPs -  Physician Assistants and Nurse Practitioners) who all work together to provide you with the care you need, when you need it.   Your next appointment:   1 year(s)  Provider:   Christell Constant, MD

## 2022-10-31 ENCOUNTER — Other Ambulatory Visit: Payer: Self-pay | Admitting: Internal Medicine

## 2022-11-01 DIAGNOSIS — I10 Essential (primary) hypertension: Secondary | ICD-10-CM | POA: Diagnosis not present

## 2022-11-01 DIAGNOSIS — I4891 Unspecified atrial fibrillation: Secondary | ICD-10-CM | POA: Diagnosis not present

## 2022-11-01 DIAGNOSIS — E119 Type 2 diabetes mellitus without complications: Secondary | ICD-10-CM | POA: Diagnosis not present

## 2022-11-01 DIAGNOSIS — D6869 Other thrombophilia: Secondary | ICD-10-CM | POA: Diagnosis not present

## 2022-11-01 DIAGNOSIS — E7800 Pure hypercholesterolemia, unspecified: Secondary | ICD-10-CM | POA: Diagnosis not present

## 2022-11-01 DIAGNOSIS — Z0000 Encounter for general adult medical examination without abnormal findings: Secondary | ICD-10-CM | POA: Diagnosis not present

## 2022-11-01 DIAGNOSIS — I4892 Unspecified atrial flutter: Secondary | ICD-10-CM | POA: Diagnosis not present

## 2022-11-01 DIAGNOSIS — E1169 Type 2 diabetes mellitus with other specified complication: Secondary | ICD-10-CM | POA: Diagnosis not present

## 2022-11-02 ENCOUNTER — Telehealth: Payer: Self-pay | Admitting: Internal Medicine

## 2022-11-02 MED ORDER — CARVEDILOL 6.25 MG PO TABS: 6.2500 mg | ORAL_TABLET | Freq: Two times a day (BID) | ORAL | 3 refills | Status: DC

## 2022-11-02 NOTE — Telephone Encounter (Signed)
 *  STAT* If patient is at the pharmacy, call can be transferred to refill team.   1. Which medications need to be refilled? (please list name of each medication and dose if known) carvedilol (COREG) 6.25 MG tablet    2. Would you like to learn more about the convenience, safety, & potential cost savings by using the [facility]?    3. Are you open to using the [facility] (Type [facility].    4. Which pharmacy/location (including street and city if local pharmacy) is medication to be sent to?[id number] - [place], [place] - [address]    5. Do they need a 30 day or 90 day supply? 90 days

## 2022-11-02 NOTE — Telephone Encounter (Signed)
Pt's medication was sent to pt's pharmacy as requested. Confirmation received.  °

## 2022-11-10 ENCOUNTER — Other Ambulatory Visit: Payer: Self-pay | Admitting: Internal Medicine

## 2023-01-28 ENCOUNTER — Other Ambulatory Visit: Payer: Self-pay

## 2023-01-28 ENCOUNTER — Other Ambulatory Visit: Payer: Self-pay | Admitting: Internal Medicine

## 2023-01-28 DIAGNOSIS — I484 Atypical atrial flutter: Secondary | ICD-10-CM

## 2023-01-28 NOTE — Telephone Encounter (Signed)
 Prescription refill request for Eliquis received. Indication:aflutter Last office visit:10/24 EXB:MWUXL labs Age: 83 Weight:117  kg  Prescription refilled

## 2023-02-28 ENCOUNTER — Ambulatory Visit (HOSPITAL_COMMUNITY): Payer: Medicare PPO

## 2023-03-28 ENCOUNTER — Ambulatory Visit (HOSPITAL_COMMUNITY): Payer: Medicare PPO | Attending: Internal Medicine

## 2023-03-28 DIAGNOSIS — I350 Nonrheumatic aortic (valve) stenosis: Secondary | ICD-10-CM | POA: Diagnosis not present

## 2023-03-28 DIAGNOSIS — I484 Atypical atrial flutter: Secondary | ICD-10-CM | POA: Diagnosis not present

## 2023-03-28 LAB — ECHOCARDIOGRAM COMPLETE
AR max vel: 2.12 cm2
AV Area VTI: 1.75 cm2
AV Area mean vel: 2.01 cm2
AV Mean grad: 11 mmHg
AV Peak grad: 19.7 mmHg
Ao pk vel: 2.22 m/s
Est EF: 64
MV M vel: 4.79 m/s
MV Peak grad: 91.8 mmHg
S' Lateral: 3.2 cm

## 2023-03-30 ENCOUNTER — Encounter: Payer: Self-pay | Admitting: Internal Medicine

## 2023-03-30 ENCOUNTER — Other Ambulatory Visit: Payer: Self-pay | Admitting: Internal Medicine

## 2023-03-30 NOTE — Telephone Encounter (Signed)
 Pt last saw Dr Izora Ribas 10/27/22, last labs 11/03/22 Creat 0.84, age 83, weight 117kg, based on specified criteria pt is on appropriate dosage of Eliquis 5mg  BID for aflutter.  Will refill rx.

## 2023-04-18 DIAGNOSIS — H40013 Open angle with borderline findings, low risk, bilateral: Secondary | ICD-10-CM | POA: Diagnosis not present

## 2023-04-18 DIAGNOSIS — E119 Type 2 diabetes mellitus without complications: Secondary | ICD-10-CM | POA: Diagnosis not present

## 2023-08-23 DIAGNOSIS — L820 Inflamed seborrheic keratosis: Secondary | ICD-10-CM | POA: Diagnosis not present

## 2023-08-23 DIAGNOSIS — D485 Neoplasm of uncertain behavior of skin: Secondary | ICD-10-CM | POA: Diagnosis not present

## 2023-09-10 ENCOUNTER — Other Ambulatory Visit: Payer: Self-pay | Admitting: Internal Medicine

## 2023-09-10 DIAGNOSIS — I484 Atypical atrial flutter: Secondary | ICD-10-CM

## 2023-09-12 NOTE — Telephone Encounter (Signed)
 Eliquis  5mg  refill request received. Patient is 83 years old, weight-117kg, Crea-0.84 on 11/03/22 via [facility] from [place], [place], and last seen by Dr. Santo on 10/27/22. Dose is appropriate based on dosing criteria. Will send in refill to requested pharmacy.

## 2023-09-27 ENCOUNTER — Other Ambulatory Visit: Payer: Self-pay

## 2023-09-29 ENCOUNTER — Telehealth: Payer: Self-pay | Admitting: Internal Medicine

## 2023-09-29 ENCOUNTER — Other Ambulatory Visit: Payer: Self-pay | Admitting: Internal Medicine

## 2023-09-29 MED ORDER — AMLODIPINE BESYLATE 2.5 MG PO TABS: 2.5000 mg | ORAL_TABLET | Freq: Every day | ORAL | 0 refills | Status: DC

## 2023-09-29 NOTE — Telephone Encounter (Signed)
 Pt's medication was sent to pt's pharmacy as requested. Confirmation received.

## 2023-09-29 NOTE — Telephone Encounter (Signed)
*  STAT* If patient is at the pharmacy, call can be transferred to refill team.   1. Which medications need to be refilled? (please list name of each medication and dose if known) amLODipine (NORVASC) 2.5 MG tablet  2. Which pharmacy/location (including street and city if local pharmacy) is medication to be sent to? [id number] - [place], [place] - [address]  3. Do they need a 30 day or 90 day supply?  90 day supply

## 2023-10-16 ENCOUNTER — Other Ambulatory Visit: Payer: Self-pay | Admitting: Internal Medicine

## 2023-10-17 ENCOUNTER — Telehealth: Payer: Self-pay | Admitting: Internal Medicine

## 2023-10-17 MED ORDER — CARVEDILOL 6.25 MG PO TABS: 6.2500 mg | ORAL_TABLET | Freq: Two times a day (BID) | ORAL | 3 refills | Status: AC

## 2023-10-17 MED ORDER — ROSUVASTATIN CALCIUM 20 MG PO TABS: 20.0000 mg | ORAL_TABLET | Freq: Every day | ORAL | 3 refills | Status: AC

## 2023-10-17 NOTE — Telephone Encounter (Signed)
 Filled

## 2023-10-17 NOTE — Telephone Encounter (Signed)
*  STAT* If patient is at the pharmacy, call can be transferred to refill team.   1. Which medications need to be refilled? (please list name of each medication and dose if known)    rosuvastatin  (CRESTOR ) 20 MG tablet     carvedilol  (COREG ) 6.25 MG tablet     4. Which pharmacy/location (including street and city if local pharmacy) is medication to be sent to?  [id number] - [place], [place] - [address]     5. Do they need a 30 day or 90 day supply? 90

## 2023-12-19 ENCOUNTER — Ambulatory Visit: Attending: Internal Medicine | Admitting: Internal Medicine

## 2023-12-19 VITALS — BP 150/78 | HR 70 | Ht 72.0 in | Wt 269.0 lb

## 2023-12-19 DIAGNOSIS — Z79899 Other long term (current) drug therapy: Secondary | ICD-10-CM | POA: Diagnosis not present

## 2023-12-19 DIAGNOSIS — I484 Atypical atrial flutter: Secondary | ICD-10-CM | POA: Diagnosis not present

## 2023-12-19 DIAGNOSIS — I350 Nonrheumatic aortic (valve) stenosis: Secondary | ICD-10-CM

## 2023-12-19 MED ORDER — HYDROCHLOROTHIAZIDE 12.5 MG PO CAPS: 12.5000 mg | ORAL_CAPSULE | Freq: Every day | ORAL | 3 refills | Status: AC

## 2023-12-19 NOTE — Progress Notes (Signed)
 Cardiology Office Note:    Date:  12/19/2023   ID:  Terance Pomplun, DOB 12/17/1940, MRN [id number]  PCP:  Kip Righter, MD  [facility] Cardiologist:  Stanly Leavens MD [facility] Electrophysiologist:  None   CC: Follow up AFL, and LFLGAS AS  History of Present Illness:    Jatniel Verastegui is a 83 y.o. male with a hx of Diabetes and HLD, Persistent AFl, and Asthma who presents for evaluation 01/02/20.    2022: had elevated gradients. CAC score done showing CAD and AV Calcium . 2023: asymptomatic BP well controlled. 2024: Moderate AS (LFLG-AS)  Mr. Cindric is an 83 yo M with longstanding persistent atrial flutter and atrial fibrillation who presents for follow-up of his cardiovascular conditions.  He has a history of longstanding persistent atrial flutter with atrial fibrillation and has opted for rate control over the past few years. He reports no symptoms related to his atrial fibrillation since 2022. He is on a blood thinner to decrease his risk of stroke without experiencing bleeding or bruising.  He has moderate aortic stenosis and asymptomatic coronary artery calcifications with an aortic valve calcium  score of approximately 800. No chest pain, chest pressure, or chest tightness. He can walk long distances on flat terrain without difficulty but experiences shortness of breath when walking up inclines.  His blood pressure has been persistently elevated, consistent with readings at home and during visits to his primary care doctor. He is on amlodipine  for blood pressure management. He has not been regularly checking his blood pressure at home.  He reports some swelling in his legs, managed with compression socks worn at night, with the swelling subsiding by morning. He remains active, using a recumbent bike five times a week and incorporating light weights into his routine. He has reduced his travel and focuses on activities closer to home.  His LDL cholesterol is well  controlled on his current therapy. He has not participated in any clinical trials for his conditions.   Discussed the use of AI scribe software for clinical note transcription with the patient, who gave verbal consent to proceed.   Past Medical History:  Diagnosis Date   Allergic rhinitis    Asthma    Atrial fibrillation (HCC)    Body mass index (BMI) 32.0-32.9, adult    Diabetes mellitus without complication (HCC)    DM (diabetes mellitus) (HCC)    Elevated blood pressure reading    GERD (gastroesophageal reflux disease)    High cholesterol    Hypercholesterolemia    Hyperlipidemia    Irregular heart rhythm    Microalbuminuria    Nasal polyposis    Obesity    Plantar wart    Small bowel obstruction (HCC)     Past Surgical History:  Procedure Laterality Date   ABDOMINAL EXPLORATION SURGERY  2010   found blockage on cxr---surgery showed nothing   CYST EXCISION  1966    Current Medications: Current Meds  Medication Sig   acetaminophen (TYLENOL) 500 MG tablet Take 1,000 mg by mouth every 6 (six) hours as needed for mild pain or headache.   albuterol  (PROVENTIL  HFA;VENTOLIN  HFA) 108 (90 BASE) MCG/ACT inhaler Inhale 2 puffs into the lungs every 6 (six) hours as needed for wheezing.   amLODipine  (NORVASC ) 2.5 MG tablet Take 1 tablet (2.5 mg total) by mouth daily.   carvedilol  (COREG ) 6.25 MG tablet Take 1 tablet (6.25 mg total) by mouth 2 (two) times daily.   ELIQUIS  5 MG TABS tablet TAKE 1 TABLET  BY MOUTH TWICE A DAY   hydrochlorothiazide  (MICROZIDE ) 12.5 MG capsule Take 1 capsule (12.5 mg total) by mouth daily.   rosuvastatin  (CRESTOR ) 20 MG tablet Take 1 tablet (20 mg total) by mouth daily.    Allergies:   Patient has no known allergies.   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  Tobacco Use   Smoking status: Never   Smokeless tobacco: Never  Substance and Sexual Activity   Alcohol use: Yes    Comment: occassional//rare-about 2 glasses of white wine a month   Drug use: No   Sexual activity: Never  Other Topics Concern   Not on file  Social History Narrative   Not on file   Social Drivers of Health   Financial Resource Strain: Not on file  Food Insecurity: Not on file  Transportation Needs: Not on file  Physical Activity: Not on file  Stress: Not on file  Social Connections: Not on file    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 No hx of atrial fibrillation or atrial flutter  ROS:   Please see the history of present illness.      EKGs/Labs/Other Studies Reviewed:      Cardiac Studies & Procedures   ______________________________________________________________________________________________     ECHOCARDIOGRAM  ECHOCARDIOGRAM COMPLETE 03/28/2023  Narrative ECHOCARDIOGRAM REPORT    Patient Name:   AVIRAJ KENTNER Date of Exam: 03/28/2023 Medical Rec #:  [id number]      Height:       72.0 in Accession #:    [id number]     Weight:       258.0 lb Date of Birth:  17-Feb-1940      BSA:          2.374 m Patient Age:    82 years       BP:           178/104 mmHg Patient Gender: M              HR:           64 bpm. Exam Location:  [address]  Procedure: 2D Echo, Cardiac Doppler, Color Doppler and 3D Echo (Both Spectral and Color Flow Doppler were utilized during procedure).  Indications:    Aortic Stenosis I35.0  History:        Patient has prior history of Echocardiogram examinations, most recent 03/01/2022. Arrythmias:Atrial Fibrillation;  Risk Factors:Dyslipidemia and Diabetes.  Sonographer:    Augustin Seals RDCS Referring Phys: [id number] [facility] A [facility]ANT[facility]   Sonographer Comments: Global longitudinal strain was attempted. IMPRESSIONS   1. Strain not performed. Left ventricular ejection fraction, by estimation, is 64%. The left ventricle has normal function. The left ventricle has no regional wall motion abnormalities. There is mild left ventricular hypertrophy. Left ventricular diastolic parameters are indeterminate. 2. Right ventricular systolic function is normal. The right ventricular size is normal. There is normal pulmonary artery systolic pressure. 3. Left atrial size was moderately dilated. 4. Right atrial size was moderately dilated. 5. The mitral valve is abnormal. Mild  mitral valve regurgitation. No evidence of mitral stenosis. Moderate mitral annular calcification. 6. Gradient increased since 03/01/22 now with with mild AS . The aortic valve is tricuspid. There is moderate calcification of the aortic valve. There is moderate thickening of the aortic valve. Aortic valve regurgitation is trivial. Mild aortic valve stenosis. 7. Aortic dilatation noted. There is mild dilatation of the ascending aorta, measuring 40 mm. 8. The inferior vena cava is normal in size with greater than 50% respiratory variability, suggesting right atrial pressure of 3 mmHg.  FINDINGS Left Ventricle: Strain not performed. Left ventricular ejection fraction, by estimation, is 64%. The left ventricle has normal function. The left ventricle has no regional wall motion abnormalities. Strain was performed and the global longitudinal strain is indeterminate. The left ventricular internal cavity size was normal in size. There is mild left ventricular hypertrophy. Left ventricular diastolic parameters are indeterminate.  Right Ventricle: The right ventricular size is normal. No increase in right ventricular wall thickness. Right ventricular  systolic function is normal. There is normal pulmonary artery systolic pressure. The tricuspid regurgitant velocity is 2.31 m/s, and with an assumed right atrial pressure of 8 mmHg, the estimated right ventricular systolic pressure is 29.3 mmHg.  Left Atrium: Left atrial size was moderately dilated.  Right Atrium: Right atrial size was moderately dilated.  Pericardium: There is no evidence of pericardial effusion.  Mitral Valve: The mitral valve is abnormal. There is moderate thickening of the mitral valve leaflet(s). There is moderate calcification of the mitral valve leaflet(s). Moderate mitral annular calcification. Mild mitral valve regurgitation. No evidence of mitral valve stenosis.  Tricuspid Valve: The tricuspid valve is normal in structure. Tricuspid valve regurgitation is mild . No evidence of tricuspid stenosis.  Aortic Valve: Gradient increased since 03/01/22 now with with mild AS. The aortic valve is tricuspid. There is moderate calcification of the aortic valve. There is moderate thickening of the aortic valve. Aortic valve regurgitation is trivial. Mild aortic stenosis is present. Aortic valve mean gradient measures 11.0 mmHg. Aortic valve peak gradient measures 19.7 mmHg. Aortic valve area, by VTI measures 1.75 cm.  Pulmonic Valve: The pulmonic valve was normal in structure. Pulmonic valve regurgitation is not visualized. No evidence of pulmonic stenosis.  Aorta: Aortic dilatation noted. There is mild dilatation of the ascending aorta, measuring 40 mm.  Venous: The inferior vena cava is normal in size with greater than 50% respiratory variability, suggesting right atrial pressure of 3 mmHg.  IAS/Shunts: No atrial level shunt detected by color flow Doppler.  Additional Comments: 3D was performed not requiring image post processing on an independent workstation and was normal.   LEFT VENTRICLE PLAX 2D LVIDd:         4.60 cm   Diastology LVIDs:         3.20 cm   LV e'  medial:  7.10 cm/s LV PW:         1.20 cm   LV e' lateral: 9.37 cm/s LV IVS:        1.20 cm LVOT diam:     2.40 cm LV SV:         89 LV SV Index:   37 LVOT Area:     4.52 cm  3D Volume EF: 3D EF:        64 % LV EDV:       163 ml LV ESV:       58 ml LV SV:        104 ml  RIGHT VENTRICLE             IVC RV Basal diam:  5.00 cm     IVC diam: 2.40 cm RV Mid diam:    4.30 cm RV S prime:     10.70 cm/s TAPSE (M-mode): 1.3 cm  LEFT ATRIUM             Index        RIGHT ATRIUM           Index LA diam:        5.40 cm 2.27 cm/m   RA Area:     29.90 cm LA Vol (A2C):   73.0 ml 30.75 ml/m  RA Volume:   99.70 ml  42.00 ml/m LA Vol (A4C):   83.0 ml 34.96 ml/m LA Biplane Vol: 78.6 ml 33.11 ml/m AORTIC VALVE AV Area (Vmax):    2.12 cm AV Area (Vmean):   2.01 cm AV Area (VTI):     1.75 cm AV Vmax:           222.00 cm/s AV Vmean:          152.000 cm/s AV VTI:            0.508 m AV Peak Grad:      19.7 mmHg AV Mean Grad:      11.0 mmHg LVOT Vmax:         104.00 cm/s LVOT Vmean:        67.400 cm/s LVOT VTI:          0.196 m LVOT/AV VTI ratio: 0.39  AORTA Ao Root diam: 3.60 cm Ao Asc diam:  4.00 cm  MR Peak grad: 91.8 mmHg   TRICUSPID VALVE MR Mean grad: 68.0 mmHg   TR Peak grad:   21.3 mmHg MR Vmax:      479.00 cm/s TR Vmax:        231.00 cm/s MR Vmean:     399.0 cm/s SHUNTS Systemic VTI:  0.20 m Systemic Diam: 2.40 cm  Maude Emmer MD Electronically signed by Maude Emmer MD Signature Date/Time: 03/28/2023/11:34:22 AM    Final      CT SCANS  CT CARDIAC SCORING (SELF PAY ONLY) 03/27/2021  Addendum 03/27/2021  1:00 PM ADDENDUM REPORT: 03/27/2021 12:57  CLINICAL DATA:  31M for cardiovascular disease risk stratification  EXAM: Coronary Calcium  Score  TECHNIQUE: A gated, non-contrast computed tomography scan of the heart was performed using 3mm slice thickness. Axial images were analyzed on a dedicated workstation. Calcium  scoring of the coronary arteries  was performed using the Agatston method.  FINDINGS: Coronary arteries: Normal origins.  Coronary Calcium  Score:  Left main: 52.8  Left anterior descending artery: 540  Left circumflex artery: 153  Right coronary artery: 314  Total: 1060  Percentile: 73rd  Pericardium: Normal.  Ascending Aorta: Mildly dilated.  3.9 cm.  Aortic atherosclerosis.  Mild mitral annular calcification.  Non-cardiac: See separate report from [facility].  IMPRESSION: Coronary calcium  score of 1060. This was 73rd percentile for age-, race-, and sex-matched controls.  Mitral annular calcification.  Ascending aorta mildly dilated.  3.9 cm.  Aortic atherosclerosis.  RECOMMENDATIONS: Coronary artery calcium  (CAC) score is a strong predictor of incident coronary heart disease (CHD) and provides predictive information beyond traditional risk factors. CAC scoring is reasonable to use in the decision to withhold, postpone, or initiate statin therapy in intermediate-risk or selected borderline-risk asymptomatic adults (age 39-75 years and LDL-C >=70 to <190 mg/dL) who do not have diabetes  or established atherosclerotic cardiovascular disease (ASCVD).* In intermediate-risk (10-year ASCVD risk >=7.5% to <20%) adults or selected borderline-risk (10-year ASCVD risk >=5% to <7.5%) adults in whom a CAC score is measured for the purpose of making a treatment decision the following recommendations have been made:  If CAC=0, it is reasonable to withhold statin therapy and reassess in 5 to 10 years, as long as higher risk conditions are absent (diabetes mellitus, family history of premature CHD in first degree relatives (males <55 years; females <65 years), cigarette smoking, or LDL >=190 mg/dL).  If CAC is 1 to 99, it is reasonable to initiate statin therapy for patients >=101 years of age.  If CAC is >=100 or >=75th percentile, it is reasonable to initiate statin therapy at any  age.  Cardiology referral should be considered for patients with CAC scores >=400 or >=75th percentile.  *2018 AHA/ACC/AACVPR/AAPA/ABC/ACPM/ADA/AGS/APhA/ASPC/NLA/PCNA Guideline on the Management of Blood Cholesterol: A Report of the American College of Cardiology/American Heart Association Task Force on Clinical Practice Guidelines. J Am Coll Cardiol. 2019;73(24):3168-3209.  Annabella Scarce, MD   Electronically Signed By: Annabella Scarce M.D. On: 03/27/2021 12:57  Narrative : The following report is an over-read performed by radiologist Dr. Reyes Holder of [facility] on March 27, 2021. This over-read does not include interpretation of cardiac or coronary anatomy or pathology. The coronary calcium  score interpretation by the cardiologist is attached.  COMPARISON:  None.  FINDINGS: Vascular: Coronary artery calcifications. Calcifications aortic valve. Aortic atherosclerosis without aneurysmal dilation.  Mediastinum/Nodes: No pathologically enlarged mediastinal, hilar or axillary lymph nodes, noting limited sensitivity for the detection of hilar adenopathy on this noncontrast study. Esophagus is unremarkable.  Lungs/Pleura: Diffuse bronchial wall thickening and slight mosaic attenuation of the lungs. No suspicious pulmonary nodules or masses. Mild lower lobe predominant subpleural reticulations.  Upper Abdomen: No acute abnormality.  Musculoskeletal: No acute osseous abnormality.  IMPRESSION: 1. Diffuse bronchial wall thickening with slight mosaic attenuation of the lungs, possibly reflecting small airways disease. 2. Mild lower lobe predominant subpleural reticulations, which may reflect mild interstitial lung disease. 3. Aortic Atherosclerosis (ICD10-I70.0).  Electronically Signed: By: Reyes Holder M.D. On: 03/27/2021 09:38     ______________________________________________________________________________________________       Recent  Labs: No results found for requested labs within last 365 days.  Recent Lipid Panel    Component Value Date/Time   CHOL 104 10/19/2010 0645   TRIG 53 10/19/2010 0645   HDL 37 (L) 10/19/2010 0645   CHOLHDL 2.8 10/19/2010 0645   VLDL 11 10/19/2010 0645   LDLCALC 56 10/19/2010 0645     Physical Exam:    VS:  BP (!) 150/78   Pulse 70   Ht 6' (1.829 m)   Wt 269 lb (122 kg)   BMI 36.48 kg/m     Wt Readings from Last 3 Encounters:  12/19/23 269 lb (122 kg)  10/27/22 258 lb (117 kg)  04/03/21 254 lb (115.2 kg)    Gen: No distress  Neck: No JVD Cardiac: No Rubs or Gallops, systolic murmur, RRR Respiratory: Clear to auscultation bilaterally, normal effort, normal  respiratory rate GI: Soft, nontender, non-distended  MS: New bilateral non pitting edema;  moves all extremities Integument: Skin feels warm Neuro:  At time of evaluation, alert and oriented to person/place/time/situation  Psych: Normal affect, patient feels    ASSESSMENT/PLAN:    Permanent atrial fibrillation and atrial flutter, rate controlled Longstanding persistent atrial flutter with atrial fibrillation, currently rate controlled. Asymptomatic since 2022. Shared  decision-making led to rate control strategy. No current plan for rhythm control due to uncertainty of success and asymptomatic status. - Continue current rate control strategy.  Moderate low flow, low gradient aortic stenosis Mild to moderate low flow, low gradient aortic stenosis with a calcium  score of 800. Asymptomatic with no severe stenosis. Discussed potential progression to severe stenosis and TAVR procedure if symptoms develop. Deferred participation in [place] clinical trial for prevention of progression to severe stenosis. - Will repeat echocardiogram in June or March 2026. - Monitor for symptoms of severe aortic stenosis. - Will consider TAVR if severe stenosis develops.  Hypertension with lower extremity edema Persistently elevated blood  pressure with lower extremity edema. Currently on low dose amlodipine . Discussed increasing amlodipine  and adding hydrochlorothiazide  to manage blood pressure and edema. Emphasized importance of monitoring blood pressure and edema. - Continue norvasc  - Started hydrochlorothiazide  12.5 mg PO daily. - Ordered BMP in two weeks to monitor electrolytes. - Monitor blood pressure and edema regularly. - Will adjust treatment based on lab results and symptomatology.  CAC and HLD - well controlled on current therapy; no changes  Longitudinal care: The evaluation and management services provided today reflect the complexity inherent in caring for this patient, including the ongoing longitudinal relationship and management of multiple chronic conditions and/or the need for care coordination. The visit required a comprehensive assessment and management plan tailored to the patient's unique needs Time was spent addressing not only the acute concerns but also the broader context of the patient's health, including preventive care, chronic disease management, and care coordination as appropriate.  Complex longitudinal is necessary for conditions including: Secondary prevention of AS with HTN and HLD at goal; now that he is no longer lying to [place], we are focusing on more aggressive  prevention   Stanly Leavens, MD FASE [facility] Cardiologist [facility]  [address] [place], [place] [address] [phone]  10:28 AM

## 2023-12-19 NOTE — Patient Instructions (Signed)
 Medication Instructions:  Your physician has recommended you make the following change in your medication: START Hydrochlorothiazide  12.5 mg daily  *If you need a refill on your cardiac medications before your next appointment, please call your pharmacy*  Lab Work: Your physician recommends that you return for lab work in: 2 weeks for a BMET  If you have any lab test that is abnormal or we need to change your treatment, we will call you to review the results.  Testing/Procedures: Your physician has requested that you have an echocardiogram in March 2026. Echocardiography is a painless test that uses sound waves to create images of your heart. It provides your doctor with information about the size and shape of your heart and how well your heart's chambers and valves are working. This procedure takes approximately one hour. There are no restrictions for this procedure. Please do NOT wear cologne, perfume, aftershave, or lotions (deodorant is allowed). Please arrive 15 minutes prior to your appointment time.  Please note: We ask at that you not bring children with you during ultrasound (echo/ vascular) testing. Due to room size and safety concerns, children are not allowed in the ultrasound rooms during exams. Our front office staff cannot provide observation of children in our lobby area while testing is being conducted. An adult accompanying a patient to their appointment will only be allowed in the ultrasound room at the discretion of the ultrasound technician under special circumstances. We apologize for any inconvenience.   Follow-Up: At [facility], you and your health needs are our priority.  As part of our continuing mission to provide you with exceptional heart care, our providers are all part of one team.  This team includes your primary Cardiologist (physician) and Advanced Practice Providers or APPs (Physician Assistants and Nurse Practitioners) who all work together to  provide you with the care you need, when you need it.  Your next appointment:   1 year(s)  Provider:   Stanly DELENA Leavens, MD    Thank you for choosing [facility]!!   [phone]   Other Instructions  Hydrochlorothiazide  Capsules or Tablets What is this medication? HYDROCHLOROTHIAZIDE  (hye DROE klor oh THYE a zide ) treats high blood pressure. It may also be used to reduce swelling related to heart, kidney, or liver disease. It works by helping your kidneys remove more fluid and salt from your blood through the urine. It belongs to a group of medications called diuretics. This medicine may be used for other purposes; ask your health care provider or pharmacist if you have questions. COMMON BRAND NAME(S): Esidrix , Ezide, HydroDIURIL , Microzide , Oretic , Zide  What should I tell my care team before I take this medication? They need to know if you have any of these conditions: Diabetes Gout Kidney disease Liver disease Lupus An unusual or allergic reaction to hydrochlorothiazide , other medications, foods, dyes, or preservatives Pregnant or trying to get pregnant Breastfeeding How should I use this medication? Take this medication by mouth. Take it as directed on the prescription label at the same time every day. You can take it with or without food. If it upsets your stomach, take it with food. Keep taking it unless your care team tells you to stop. Talk to your care team about the use of this medication in children. While it may be prescribed for children for selected conditions, precautions do apply. Overdosage: If you think you have taken too much of this medicine contact a poison control center or emergency room at once. NOTE:  This medicine is only for you. Do not share this medicine with others. What if I miss a dose? If you miss a dose, take it as soon as you can. If it is almost time for your next dose, take only that dose. Do not take double or extra doses. What may  interact with this medication? Do not take this medication with any of the following: Cidofovir Dofetilide Tranylcypromine This medication may also interact with the following: Cholestyramine Colestipol Digoxin Lithium Medications for diabetes NSAIDS, medications for pain and inflammation, such as ibuprofen or naproxen Other medications for blood pressure This list may not describe all possible interactions. Give your health care provider a list of all the medicines, herbs, non-prescription drugs, or dietary supplements you use. Also tell them if you smoke, drink alcohol, or use illegal drugs. Some items may interact with your medicine. What should I watch for while using this medication? Visit your care team for regular checks on your progress. Check your blood pressure as directed. Know what your blood pressure should be and when to contact your care team. This medication may affect your coordination, reaction time, or judgment. Do not drive or operate machinery until you know how this medication affects you. Sit up or stand slowly to reduce the risk of dizzy or fainting spells. Drinking alcohol with this medication can increase the risk of these side effects. Taking this medication is only part of a total heart healthy program. Ask your care team if there are other changes you can make to improve your overall health. Do not treat yourself for coughs, colds, or pain while you are using this medication without asking your care team for advice. Some medications may increase your blood pressure. Talk to your care team about your risk of skin cancer. You may be more at risk for skin cancer if you take this medication. To lower your risk, keep out of the sun. If you cannot avoid being in the sun, wear protective clothing and sunscreen. Do not use sun lamps, tanning beds, or tanning booths. This medication may increase blood sugar. The risk may be higher in patients who already have diabetes. Ask  your care team what you can do to lower your risk of diabetes while taking this medication. What side effects may I notice from receiving this medication? Side effects that you should report to your care team as soon as possible: Allergic reactions--skin rash, itching, hives, swelling of the face, lips, tongue, or throat Dehydration--increased thirst, dry mouth, feeling faint or lightheaded, headache, dark yellow or brown urine Gout--severe pain, redness, warmth, or swelling in joints, such as the big toe Kidney injury--decrease in the amount of urine, swelling of the ankles, hands, or feet Low blood pressure--dizziness, feeling faint or lightheaded, blurry vision Low potassium level--muscle pain or cramps, unusual weakness, fatigue, fast or irregular heartbeat, constipation Sudden eye pain or change in vision such as blurred vision, seeing halos around lights, vision loss Side effects that usually do not require medical attention (report to your care team if they continue or are bothersome): Change in sex drive or performance Headache Upset stomach This list may not describe all possible side effects. Call your doctor for medical advice about side effects. You may report side effects to FDA at 1-800-FDA-1088. Where should I keep my medication? Keep out of the reach of children and pets. Store at room temperature between 20 and 25 degrees C (68 and 77 degrees F). Protect from light and moisture. Keep the  container tightly closed. Do not freeze. Get rid of any unused medication after the expiration date. To get rid of medications that are no longer needed or have expired: Take the medication to a take-back program. Check with your pharmacy or law enforcement to find a location. If you cannot return the medication, check the label or package insert to see if the medication should be thrown out in the garbage or flushed down the toilet. If you are not sure, ask your care team. If it is safe to put  it in the trash, empty the medication out of the container. Mix it with cat litter, dirt, coffee grounds, or another unwanted substance. Seal the mixture in a bag or container. Put it in the trash. NOTE: This sheet is a summary. It may not cover all possible information. If you have questions about this medicine, talk to your doctor, pharmacist, or health care provider.  2025 [employer] Standard (2023-03-11 00:00:00)

## 2023-12-25 ENCOUNTER — Other Ambulatory Visit: Payer: Self-pay | Admitting: Internal Medicine

## 2024-01-02 LAB — BASIC METABOLIC PANEL WITH GFR
BUN/Creatinine Ratio: 23 (ref 10–24)
BUN: 21 mg/dL (ref 8–27)
CO2: 24 mmol/L (ref 20–29)
Calcium: 9.7 mg/dL (ref 8.6–10.2)
Chloride: 103 mmol/L (ref 96–106)
Creatinine, Ser: 0.92 mg/dL (ref 0.76–1.27)
Glucose: 117 mg/dL — ABNORMAL HIGH (ref 70–99)
Potassium: 4.4 mmol/L (ref 3.5–5.2)
Sodium: 140 mmol/L (ref 134–144)
eGFR: 83 mL/min/1.73 (ref >59)

## 2024-01-03 ENCOUNTER — Ambulatory Visit: Payer: Self-pay | Admitting: Internal Medicine

## 2024-02-27 ENCOUNTER — Other Ambulatory Visit: Payer: Self-pay

## 2024-02-27 DIAGNOSIS — I484 Atypical atrial flutter: Secondary | ICD-10-CM

## 2024-02-27 MED ORDER — APIXABAN 5 MG PO TABS: 5.0000 mg | ORAL_TABLET | Freq: Two times a day (BID) | ORAL | 10 refills | Status: AC

## 2024-02-27 NOTE — Telephone Encounter (Signed)
 Eliquis  5mg  refill request received. Patient is 84 years old, weight-122kg, Crea-0.92 on 01/02/24, Diagnosis-Aflutter, and last seen by Dr. Santo on 12/19/23. Dose is appropriate based on dosing criteria. Will send in refill to requested pharmacy.

## 2024-03-26 ENCOUNTER — Ambulatory Visit (HOSPITAL_COMMUNITY)

## 2024-04-04 ENCOUNTER — Ambulatory Visit (HOSPITAL_COMMUNITY)
